# Patient Record
Sex: Female | Born: 1940 | Race: White | Hispanic: No | Marital: Married | Smoking: Former smoker
Health system: Southern US, Community
[De-identification: ages and names within clinical notes are randomized; demographics above are authoritative.]

## PROBLEM LIST (undated history)

## (undated) DIAGNOSIS — IMO0002 Reserved for concepts with insufficient information to code with codable children: Secondary | ICD-10-CM

## (undated) DIAGNOSIS — N816 Rectocele: Secondary | ICD-10-CM

## (undated) DIAGNOSIS — J309 Allergic rhinitis, unspecified: Secondary | ICD-10-CM

## (undated) DIAGNOSIS — E785 Hyperlipidemia, unspecified: Secondary | ICD-10-CM

## (undated) DIAGNOSIS — Q613 Polycystic kidney, unspecified: Secondary | ICD-10-CM

## (undated) DIAGNOSIS — I10 Essential (primary) hypertension: Secondary | ICD-10-CM

## (undated) DIAGNOSIS — Z780 Asymptomatic menopausal state: Secondary | ICD-10-CM

## (undated) DIAGNOSIS — K638219 Small intestinal bacterial overgrowth, unspecified: Secondary | ICD-10-CM

## (undated) DIAGNOSIS — Z993 Dependence on wheelchair: Secondary | ICD-10-CM

## (undated) DIAGNOSIS — M719 Bursopathy, unspecified: Secondary | ICD-10-CM

## (undated) DIAGNOSIS — F329 Major depressive disorder, single episode, unspecified: Secondary | ICD-10-CM

## (undated) DIAGNOSIS — F32A Depression, unspecified: Secondary | ICD-10-CM

## (undated) HISTORY — PX: BLADDER SURGERY: SHX569

## (undated) HISTORY — DX: Rectocele: N81.6

## (undated) HISTORY — PX: APPENDECTOMY: SHX54

## (undated) HISTORY — DX: Depression, unspecified: F32.A

## (undated) HISTORY — PX: BREAST SURGERY: SHX581

## (undated) HISTORY — DX: Reserved for concepts with insufficient information to code with codable children: IMO0002

## (undated) HISTORY — DX: Polycystic kidney, unspecified: Q61.3

## (undated) HISTORY — DX: Major depressive disorder, single episode, unspecified: F32.9

## (undated) HISTORY — DX: Hyperlipidemia, unspecified: E78.5

## (undated) HISTORY — DX: Allergic rhinitis, unspecified: J30.9

## (undated) HISTORY — DX: Bursopathy, unspecified: M71.9

## (undated) HISTORY — DX: Essential (primary) hypertension: I10

## (undated) HISTORY — DX: Asymptomatic menopausal state: Z78.0

## (undated) HISTORY — PX: LAPAROTOMY: SHX154

## (undated) HISTORY — PX: ANTERIOR AND POSTERIOR VAGINAL REPAIR: SUR5

---

## 1973-05-20 HISTORY — PX: ABDOMINAL HYSTERECTOMY: SHX81

## 1990-05-20 HISTORY — PX: REDUCTION MAMMAPLASTY: SUR839

## 2004-09-20 ENCOUNTER — Ambulatory Visit: Payer: Self-pay | Admitting: Internal Medicine

## 2005-10-17 ENCOUNTER — Ambulatory Visit: Payer: Self-pay | Admitting: Obstetrics and Gynecology

## 2006-10-23 ENCOUNTER — Ambulatory Visit: Payer: Self-pay | Admitting: Internal Medicine

## 2007-10-29 ENCOUNTER — Ambulatory Visit: Payer: Self-pay | Admitting: Internal Medicine

## 2008-11-01 ENCOUNTER — Ambulatory Visit: Payer: Self-pay | Admitting: Internal Medicine

## 2008-12-27 ENCOUNTER — Ambulatory Visit: Payer: Self-pay | Admitting: Gastroenterology

## 2008-12-30 ENCOUNTER — Ambulatory Visit: Payer: Self-pay | Admitting: Gastroenterology

## 2009-12-13 ENCOUNTER — Ambulatory Visit: Payer: Self-pay | Admitting: Internal Medicine

## 2010-06-27 ENCOUNTER — Ambulatory Visit: Payer: Self-pay | Admitting: Urology

## 2011-02-04 ENCOUNTER — Ambulatory Visit: Payer: Self-pay | Admitting: Internal Medicine

## 2011-11-19 ENCOUNTER — Ambulatory Visit: Payer: Self-pay | Admitting: Obstetrics and Gynecology

## 2011-11-19 LAB — BASIC METABOLIC PANEL
Anion Gap: 6 — ABNORMAL LOW (ref 7–16)
BUN: 17 mg/dL (ref 7–18)
Calcium, Total: 9.4 mg/dL (ref 8.5–10.1)
Creatinine: 0.9 mg/dL (ref 0.60–1.30)
EGFR (African American): >60
EGFR (Non-African Amer.): >60
Glucose: 95 mg/dL (ref 65–99)
Sodium: 135 mmol/L — ABNORMAL LOW (ref 136–145)

## 2011-11-19 LAB — CBC
HCT: 41.2 % (ref 35.0–47.0)
HGB: 13.9 g/dL (ref 12.0–16.0)
MCH: 31.6 pg (ref 26.0–34.0)
MCHC: 33.7 g/dL (ref 32.0–36.0)
Platelet: 250 10*3/uL (ref 150–440)
RBC: 4.4 10*6/uL (ref 3.80–5.20)
WBC: 7.3 10*3/uL (ref 3.6–11.0)

## 2011-11-25 ENCOUNTER — Ambulatory Visit: Payer: Self-pay | Admitting: Obstetrics and Gynecology

## 2011-11-26 LAB — URINALYSIS, COMPLETE
Bilirubin,UR: NEGATIVE
Blood: NEGATIVE
Leukocyte Esterase: NEGATIVE
Ph: 6 (ref 4.5–8.0)
Protein: NEGATIVE
RBC,UR: 7 /HPF (ref 0–5)
Squamous Epithelial: <1

## 2012-06-02 ENCOUNTER — Ambulatory Visit: Payer: Self-pay | Admitting: Internal Medicine

## 2012-11-30 ENCOUNTER — Ambulatory Visit: Payer: Self-pay | Admitting: Ophthalmology

## 2012-12-03 ENCOUNTER — Other Ambulatory Visit: Payer: Self-pay | Admitting: Ophthalmology

## 2012-12-03 LAB — CBC WITH DIFFERENTIAL/PLATELET
Eosinophil #: 0.2 10*3/uL (ref 0.0–0.7)
Eosinophil %: 3.7 %
HGB: 13.5 g/dL (ref 12.0–16.0)
MCH: 32.3 pg (ref 26.0–34.0)
Monocyte #: 0.4 x10 3/mm (ref 0.2–0.9)
Monocyte %: 8.1 %
Neutrophil #: 3 10*3/uL (ref 1.4–6.5)
Platelet: 246 10*3/uL (ref 150–440)
RBC: 4.2 10*6/uL (ref 3.80–5.20)
WBC: 4.8 10*3/uL (ref 3.6–11.0)

## 2014-01-17 DIAGNOSIS — I10 Essential (primary) hypertension: Secondary | ICD-10-CM | POA: Insufficient documentation

## 2014-01-17 DIAGNOSIS — J309 Allergic rhinitis, unspecified: Secondary | ICD-10-CM | POA: Insufficient documentation

## 2014-01-17 DIAGNOSIS — E785 Hyperlipidemia, unspecified: Secondary | ICD-10-CM | POA: Insufficient documentation

## 2014-06-20 LAB — HM MAMMOGRAPHY

## 2014-06-30 ENCOUNTER — Ambulatory Visit: Payer: Self-pay | Admitting: Family Medicine

## 2014-09-11 NOTE — Op Note (Signed)
PATIENT NAME:  Tracy Herman, Tracy Herman DATE OF BIRTH:  1940-09-12  DATE OF PROCEDURE:  11/25/2011  PREOPERATIVE DIAGNOSES:  1. Cystocele.  2. Rectocele.  3. Vaginal enterocele.   POSTOPERATIVE DIAGNOSES:  1. Cystocele.  2. Rectocele.  3. Vaginal enterocele.   OPERATIVE PROCEDURES:  1. Anterior colporrhaphy.  2. Posterior colporrhaphy.  3. Repair of vaginal enterocele with enterocele ligation.   SURGEON: Dr. Greggory KeeneFrancesco    ANESTHESIA: General endotracheal.   INDICATIONS: The patient is a 74 year old married white female, status post hysterectomy in the past, with symptomatic pelvic relaxation who desires definitive surgery.   FINDINGS AT SURGERY: A second- to third-degree cystocele, moderate rectocele, and enterocele. The enterocele sac was opened, explored, and closed with a pursestring suture. No significant adhesive disease was found.   DESCRIPTION OF PROCEDURE: The patient was brought to the operating room where she was placed in the supine position. General endotracheal anesthesia was induced without difficulty. She was placed in the dorsal lithotomy position using the candy-cane stirrups. A Betadine perineal/intravaginal prep and drape was performed in the standard fashion. Foley catheter was placed and was draining clear yellow urine from the bladder. The defects were noted on vaginal exam. The dome of the bladder was grasped with Allis clamps. A transverse incision between the Allis clamps was made. The vaginal mucosa was undermined with Metzenbaum scissors and incised in the midline. Sequentially the vaginal mucosa was dissected from the perivesical fascia using sharp and blunt dissection. Allis-Adair clamps were used to facilitate exposure. Once the bladder was adequately mobilized, the cystocele was repaired using vertical mattress sutures of 2-0 Vicryl suture. This was nicely accomplished. The excess vaginal mucosa was trimmed and the mucosa was then reapproximated in the  midline using 2-0 chromic sutures in a simple interrupted manner. Next the posterior colporrhaphy was performed. A diamond-shaped wedge of tissue was excised from the posterior fourchette with a scalpel. The vaginal mucosa was undermined with Metzenbaum scissors and incised in the midline. Allis-Adair retractors were used to facilitate exposure. The perirectal fascia was dissected from the vaginal mucosa through sharp and blunt dissection. The enterocele was exposed. The enterocele was entered. The enterocele sac did not demonstrate any significant adhesions. It was closed using a pursestring suture of 0 Vicryl. Next, the rectocele repair was performed using horizontal mattress sutures of  0 Vicryl. A nice shelf was created. The excess vaginal mucosa was trimmed and the vagina was reapproximated in the midline using 2-0 chromic sutures in a simple interrupted manner. Upon completion of the repair, the vagina was packed with Kerlix coated with Premarin cream. The patient was then awakened, mobilized, and taken to the recovery room in satisfactory condition.  Estimated blood loss was 100 mL.  IV fluids were 1300 mL of crystalloid. Urine output was 100 mL of clear urine.     ____________________________ Tracy DockerMartin A. Reece Mcbroom, MD mad:bjt D: 11/25/2011 15:00:43 ET T: 11/25/2011 17:46:04 ET JOB#: [id number]  cc: Daphine DeutscherMartin A. Tedford Berg, MD, <Dictator> Tracy DockerMARTIN A Shandiin Eisenbeis MD ELECTRONICALLY SIGNED 11/28/2011 19:05

## 2014-09-11 NOTE — H&P (Signed)
PATIENT NAME:  Tracy Herman, Tracy Herman MR#:  [id number] DATE OF BIRTH:  [birth year]  DATE OF ADMISSION:  11/25/2011  PREOPERATIVE DIAGNOSES: Pelvic organ prolapse.  HISTORY OF PRESENT ILLNESS: Tracy Herman is a 74 year old married white female, status post total abdominal hysterectomy on Estrace 0.3 mg p.o. daily and Estrace cream 1 gram intravaginally biweekly, who presents for pelvic organ prolapse repair. The patient has symptomatic second to third degree cystocele, large rectocele, and enterocele for which she would like to have surgical correction. She has been previously using pessary for support but now desires definitive surgery.   PAST MEDICAL HISTORY: 1. Allergic rhinitis.  2. Polycystic kidney disease.  3. Hypertension.  4. Hyperlipidemia.  5. Depression.  6. 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  7. Rectocele, large.  8. Vaginal enterocele.   PAST SURGICAL HISTORY:  1. Bilateral breast reduction 1992. 2. 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 3. Total abdominal hysterectomy in 1975. 4. Macroplastique injections for incontinence.  5. Bladder surgery.  PAST OBSTETRICS HISTORY: Para 1-0-0-2, spontaneous vaginal delivery x1.   FAMILY HISTORY: Negative for cancer of the colon, ovary, or breast.   SOCIAL HISTORY: The patient is a former smoker. She quit in 1977. She denies alcohol use. 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   DRUG ALLERGIES: Zestril causes depression, codeine.   CURRENT MEDICATIONS: 1. Premarin cream 1 gram intravaginally biweekly.  2. Premarin 0.3 mg p.o. daily.  3. Benadryl p.r.n.  4. Captopril 25 mg t.i.d.  5. Triamterene/hydrochlorothiazide 50/25 one a day.   REVIEW OF SYSTEMS: The patient denies recent illness. She denies history of coagulopathy. She denies reactive airway disease. She does have incontinence and chronic constipation.   PHYSICAL EXAMINATION: VITAL  SIGNS: Height 63 inches, weight 128.5 pounds, body mass index 22.7. Heart rate 60, blood pressure 143/68.  GENERAL: The patient is a pleasant well-appearing elderly female in no acute distress. She is alert and oriented. Affect is appropriate.   HEENT: Oropharynx is clear.   NECK: Supple. There is no thyromegaly or adenopathy.   LUNGS: Clear.   HEART: Reveals a regularly irregular heartbeat without S3, S4 or murmur.   ABDOMEN: Soft and nontender. No organomegaly.   PELVIC EXAM: External genitalia with mild atrophic changes. BUS normal. Vagina notable for second to third degree cystourethrocele, large rectocele, and enterocele. Rectal sphincter tone is normal.   EXTREMITIES: Without clubbing, cyanosis, or edema.   SKIN: Without rash.   MUSCULOSKELETAL: Exam is normal.   IMPRESSION: Pelvic organ prolapse with second to third degree cystourethrocele, a large rectocele, and enterocele.   PLAN: Anterior posterior colporrhaphy with enterocele ligation. Date of surgery is 11/25/2011.  CONSENT NOTE: Tracy GuilesMyra Herman is to undergo pelvic organ prolapse surgery. She is understanding of the planned procedures and is aware of and is accepting of all surgical risks which include but are not limited to bleeding, infection, pelvic organ injury with need for repair, blood clot disorders, anesthesia risks, and death. The patient understands that incontinence could become worse with the repair. She understands that she may have postoperative urinary retention which may require catheterizations temporarily. She is accepting of all risks and wishes to proceed with the surgery. Consent is given. All questions are answered.     ____________________________ Prentice DockerMartin A. Andilynn Delavega, MD mad:kma D: 11/22/2011 11:50:46 ET T: 11/22/2011 12:18:30 ET JOB#: [id number]  cc: Daphine DeutscherMartin A. Byrd Rushlow, MD, <Dictator> [facility] Prentice DockerMARTIN A Callie Facey MD ELECTRONICALLY SIGNED 11/24/2011  12:03 

## 2015-08-14 LAB — HM PAP SMEAR

## 2015-08-14 LAB — HM COLONOSCOPY

## 2015-08-29 ENCOUNTER — Ambulatory Visit (INDEPENDENT_AMBULATORY_CARE_PROVIDER_SITE_OTHER): Payer: Medicare Other | Admitting: Obstetrics and Gynecology

## 2015-08-29 ENCOUNTER — Encounter: Payer: Self-pay | Admitting: Obstetrics and Gynecology

## 2015-08-29 VITALS — BP 150/81 | HR 88 | Ht 63.0 in | Wt 136.5 lb

## 2015-08-29 DIAGNOSIS — Z1239 Encounter for other screening for malignant neoplasm of breast: Secondary | ICD-10-CM

## 2015-08-29 DIAGNOSIS — N8110 Cystocele, unspecified: Secondary | ICD-10-CM

## 2015-08-29 DIAGNOSIS — Z79890 Hormone replacement therapy (postmenopausal): Secondary | ICD-10-CM | POA: Diagnosis not present

## 2015-08-29 DIAGNOSIS — L989 Disorder of the skin and subcutaneous tissue, unspecified: Secondary | ICD-10-CM

## 2015-08-29 DIAGNOSIS — K469 Unspecified abdominal hernia without obstruction or gangrene: Secondary | ICD-10-CM

## 2015-08-29 DIAGNOSIS — Z124 Encounter for screening for malignant neoplasm of cervix: Secondary | ICD-10-CM | POA: Diagnosis not present

## 2015-08-29 DIAGNOSIS — IMO0002 Reserved for concepts with insufficient information to code with codable children: Secondary | ICD-10-CM

## 2015-08-29 DIAGNOSIS — Z1211 Encounter for screening for malignant neoplasm of colon: Secondary | ICD-10-CM

## 2015-08-29 DIAGNOSIS — N816 Rectocele: Secondary | ICD-10-CM | POA: Diagnosis not present

## 2015-08-29 MED ORDER — ESTRADIOL 1 MG PO TABS: 1.0000 mg | ORAL_TABLET | Freq: Every day | ORAL | Status: DC

## 2015-08-29 MED ORDER — ESTROGENS, CONJUGATED 0.625 MG/GM VA CREA: TOPICAL_CREAM | VAGINAL | Status: DC

## 2015-08-29 NOTE — Progress Notes (Signed)
Patient ID: Tracy FosterMyra C Herman, female   DOB: 12/17/1940, 75 y.o.   MRN: [id number]. ANNUAL PREVENTATIVE CARE GYN  ENCOUNTER NOTE  Subjective:       Tracy Herman is a 75 y.o. No obstetric history on file. female here for a routine annual gynecologic exam.  Current complaints: 1.  none   Status post A&P repair with enterocele ligation; asymptomatic Currently taking estradiol 1 mg daily for postmenopausal ERT therapy    Gynecologic History No LMP recorded. Patient has had a hysterectomy. Contraception: status post hysterectomy Last Pap: not needed. Results were: normal Last mammogram: 06/2014. Results were: normal Status post anterior/posterior colporrhaphy with enterocele ligation  Obstetric History OB History  No data available    Past Medical History  Diagnosis Date  . Hypertension   . Hyperlipemia   . PKD (polycystic kidney disease)   . AR (allergic rhinitis)   . Depression   . Bursitis   . Cystocele   . Menopause   . Rectocele     Past Surgical History  Procedure Laterality Date  . Anterior and posterior vaginal repair      WITH ENTEROCELE LIGATION  . Breast surgery      BILATERAL BREAST REDUCTION  . Abdominal hysterectomy  1975    TAH- MENORRHAGIA - FIBROIDS  . Bladder surgery    . Appendectomy    . Laparotomy      W.PARTIAL BOWEL RESECTION D/T BOWEL NECROSIS TO ADHESIONS    Current Outpatient Prescriptions on File Prior to Visit  Medication Sig Dispense Refill  . captopril (CAPOTEN) 25 MG tablet Take 25 mg by mouth 3 (three) times daily.    Marland Kitchen. estradiol (ESTRACE) 1 MG tablet Take 1 mg by mouth daily.    . naproxen sodium (ANAPROX) 220 MG tablet Take 220 mg by mouth 2 (two) times daily with a meal.    . triamterene-hydrochlorothiazide (DYAZIDE) 50-25 MG capsule Take 1 capsule by mouth every morning.     No current facility-administered medications on file prior to visit.    Allergies  Allergen Reactions  . Codeine   . Zestril [Lisinopril]     Social History    Social History  . Marital Status: Married    Spouse Name: N/A  . Number of Children: N/A  . Years of Education: N/A   Occupational History  . Not on file.   Social History Main Topics  . Smoking status: Former Games developermoker  . Smokeless tobacco: Not on file  . Alcohol Use: Yes     Comment: RED WINE QD  . Drug Use: No  . Sexual Activity: Not Currently    Birth Control/ Protection: Surgical   Other Topics Concern  . Not on file   Social History Narrative    Family History  Problem Relation Age of Onset  . Polycystic kidney disease Mother   . Polycystic kidney disease Sister   . Cancer Neg Hx   . Diabetes Neg Hx   . Heart disease Neg Hx     The following portions of the patient's history were reviewed and updated as appropriate: allergies, current medications, past family history, past medical history, past social history, past surgical history and problem list.  Review of Systems ROS Review of Systems - General ROS: negative for - chills, fatigue, fever, hot flashes, night sweats, weight gain or weight loss Psychological ROS: negative for - anxiety, decreased libido, depression, mood swings, physical abuse or sexual abuse Ophthalmic ROS: negative for - blurry vision, eye pain  or loss of vision ENT ROS: negative for - headaches, hearing change, visual changes or vocal changes Allergy and Immunology ROS: negative for - hives, itchy/watery eyes or seasonal allergies Hematological and Lymphatic ROS: negative for - bleeding problems, bruising, swollen lymph nodes or weight loss Endocrine ROS: negative for - galactorrhea, hair pattern changes, hot flashes, malaise/lethargy, mood swings, palpitations, polydipsia/polyuria, skin changes, temperature intolerance or unexpected weight changes Breast ROS: negative for - new or changing breast lumps or nipple discharge Respiratory ROS: negative for - cough or shortness of breath Cardiovascular ROS: negative for - chest pain, irregular  heartbeat, palpitations or shortness of breath Gastrointestinal ROS: no abdominal pain, change in bowel habits, or black or bloody stools Genito-Urinary ROS: no dysuria, trouble voiding, or hematuria Musculoskeletal ROS: negative for - joint pain or joint stiffness Neurological ROS: negative for - bowel and bladder control changes Dermatological ROS: negative for rash. POSITIVE-skin lesion on file, scaling   Objective:   BP 150/81 mmHg  Pulse 88  Ht  (1.6 m)  Wt 136 lb 8 oz (61.916 kg)  BMI 24.19 kg/m2 CONSTITUTIONAL: Well-developed, well-nourished female in no acute distress.  PSYCHIATRIC: Normal mood and affect. Normal behavior. Normal judgment and thought content. NEUROLGIC: Alert and oriented to person, place, and time. Normal muscle tone coordination. No cranial nerve deficit noted. HENT:  Normocephalic, atraumatic EYES: Conjunctivae and EOM are normal. No scleral icterus.  NECK: Normal range of motion, supple, no masses.  Normal thyroid.  SKIN: Skin is warm and dry. No rash noted. Not diaphoretic. No erythema. No pallor. Scaly lesion left foot between second and third digits CARDIOVASCULAR: Normal heart rate noted, regular rhythm, no murmur. RESPIRATORY: Clear to auscultation bilaterally. Effort and breath sounds normal, no problems with respiration noted. BREASTS: Symmetric in size. No masses, skin changes, nipple drainage, or lymphadenopathy. Mammoplasty scars well-healed ABDOMEN: Soft, no distention noted.  No tenderness, rebound or guarding.  BLADDER: Normal PELVIC:  External Genitalia: Normal  BUS: Normal  Vagina: Normal estrogen effect; first degree cystocele, Mild rectocele  Cervix: surgically absent  Uterus:surgically absent  Adnexa: Normal  RV: External Exam NormaI, No Rectal Masses and Normal Sphincter tone  MUSCULOSKELETAL: Normal range of motion. No tenderness.  No cyanosis, clubbing, or edema.  2+ distal pulses. LYMPHATIC: No Axillary, Supraclavicular, or  Inguinal Adenopathy.    Assessment:   Annual gynecologic examination 75 y.o. Contraception: status post hysterectomy bmi- 23 Status post anterior/posterior colporrhaphy with enterocele ligation Menopausal state, asymptomatic on ERT therapy; desires to continue  Plan:  Pap: Not needed Mammogram: Ordered Stool Guaiac Testing:  Ordered Labs: thru pcp Routine preventative health maintenance measures emphasized: Exercise/Diet/Weight control, Tobacco Warnings and Alcohol/Substance use risks Recommend dermatology evaluation for left foot skin lesions Return to Clinic - 1 Year   Crystal Woodland, CMA  Herold Harms, MD  Note: This dictation was prepared with Dragon dictation along with smaller phrase technology. Any transcriptional errors that result from this process are unintentional.

## 2015-08-29 NOTE — Patient Instructions (Signed)
1. No Pap smear is needed 2. Mammogram ordered 3. Stool guaiac cards are given for colon cancer screening 4. Estradiol 1 mg daily is refilled her 1 year 5. Calcium with vitamin D supplementation is encouraged 6. Recommend dermatology evaluation of skin lesions on foot 7. Return in 1 year

## 2015-10-07 LAB — FECAL OCCULT BLOOD, IMMUNOCHEMICAL: FECAL OCCULT BLD: NEGATIVE

## 2015-11-27 ENCOUNTER — Ambulatory Visit: Payer: Self-pay

## 2015-12-12 ENCOUNTER — Other Ambulatory Visit: Payer: Self-pay | Admitting: Obstetrics and Gynecology

## 2015-12-12 ENCOUNTER — Ambulatory Visit
Admission: RE | Admit: 2015-12-12 | Discharge: 2015-12-12 | Disposition: A | Discharge status: Home / Self Care | Payer: Medicare Other | Source: Ambulatory Visit | Attending: Obstetrics and Gynecology | Admitting: Obstetrics and Gynecology

## 2015-12-12 DIAGNOSIS — Z1231 Encounter for screening mammogram for malignant neoplasm of breast: Secondary | ICD-10-CM | POA: Insufficient documentation

## 2015-12-12 DIAGNOSIS — Z1239 Encounter for other screening for malignant neoplasm of breast: Secondary | ICD-10-CM

## 2016-10-02 ENCOUNTER — Telehealth: Payer: Self-pay | Admitting: Obstetrics and Gynecology

## 2016-10-02 MED ORDER — ESTRADIOL 1 MG PO TABS: 1.0000 mg | ORAL_TABLET | Freq: Every day | ORAL | 1 refills | Status: DC

## 2016-10-02 NOTE — Telephone Encounter (Signed)
Pt aware per vm - med erx.  

## 2016-10-02 NOTE — Telephone Encounter (Signed)
Patient called requesting a refill on estradiol. She will be out in 8 days. She is scheduled for her annual 6/5. Thanks

## 2016-10-21 NOTE — Progress Notes (Signed)
Patient ID: Tracy Herman, female   DOB: 1941/03/17, 76 y.o.   MRN: [id number]. ANNUAL PREVENTATIVE CARE GYN  ENCOUNTER NOTE  Subjective:       Tracy Herman is a 76 y.o. G1 P1001. female here for a routine annual gynecologic exam.  Current complaints: 1.  none   Status post A&P repair with enterocele ligation; asymptomatic Currently using Premarin cream intravaginal twice a week; no systemic ERT. Bowel function is normal. Bladder function is normal.    Gynecologic History No LMP recorded. Patient has had a hysterectomy. Contraception: status post hysterectomy Last Pap: not needed. Results were: normal Last mammogram: 11/2015 birad 1. Results were: normal Status post anterior/posterior colporrhaphy with enterocele ligation  Obstetric History OB History  No data available    Past Medical History:  Diagnosis Date  . AR (allergic rhinitis)   . Bursitis   . Cystocele   . Depression   . Hyperlipemia   . Hypertension   . Menopause   . PKD (polycystic kidney disease)   . Rectocele     Past Surgical History:  Procedure Laterality Date  . ABDOMINAL HYSTERECTOMY  1975   TAH- MENORRHAGIA - FIBROIDS  . ANTERIOR AND POSTERIOR VAGINAL REPAIR     WITH ENTEROCELE LIGATION  . APPENDECTOMY    . BLADDER SURGERY    . BREAST SURGERY     BILATERAL BREAST REDUCTION  . LAPAROTOMY     W.PARTIAL BOWEL RESECTION D/T BOWEL NECROSIS TO ADHESIONS  . REDUCTION MAMMAPLASTY Bilateral 1992    Current Outpatient Prescriptions on File Prior to Visit  Medication Sig Dispense Refill  . aspirin EC 81 MG tablet Take by mouth.    . captopril (CAPOTEN) 25 MG tablet Take 25 mg by mouth 3 (three) times daily.    Marland Kitchen conjugated estrogens (PREMARIN) vaginal cream Place vaginally 2 (two) times a week. 0.5 twice weekly 42.5 g 3  . estradiol (ESTRACE) 1 MG tablet Take 1 tablet (1 mg total) by mouth daily. 90 tablet 1  . naproxen sodium (ANAPROX) 220 MG tablet Take 220 mg by mouth 2 (two) times daily with a meal.     . triamterene-hydrochlorothiazide (DYAZIDE) 50-25 MG capsule Take 1 capsule by mouth every morning.     No current facility-administered medications on file prior to visit.     Allergies  Allergen Reactions  . Codeine   . Zestril [Lisinopril]     Social History   Social History  . Marital status: Married    Spouse name: N/A  . Number of children: N/A  . Years of education: N/A   Occupational History  . Not on file.   Social History Main Topics  . Smoking status: Former Games developer  . Smokeless tobacco: Not on file  . Alcohol use Yes     Comment: RED WINE QD  . Drug use: No  . Sexual activity: Not Currently    Birth control/ protection: Surgical   Other Topics Concern  . Not on file   Social History Narrative  . No narrative on file    Family History  Problem Relation Age of Onset  . Polycystic kidney disease Mother   . Polycystic kidney disease Sister   . Cancer Neg Hx   . Diabetes Neg Hx   . Heart disease Neg Hx     The following portions of the patient's history were reviewed and updated as appropriate: allergies, current medications, past family history, past medical history, past social history, past surgical history and  problem list.  Review of Systems Review of Systems  Constitutional: Negative.   HENT: Negative.   Eyes: Negative.   Respiratory: Negative.   Cardiovascular: Negative.   Gastrointestinal: Negative.   Genitourinary: Negative.   Musculoskeletal: Negative.   Skin: Negative.   Neurological: Negative.   Endo/Heme/Allergies: Negative.   Psychiatric/Behavioral: Negative.      Objective:   BP 127/71   Pulse 73   Ht 5\' 3"  (1.6 m)   Wt 133 lb 12.8 oz (60.7 kg)   BMI 23.70 kg/m  CONSTITUTIONAL: Well-developed, well-nourished female in no acute distress.  PSYCHIATRIC: Normal mood and affect. Normal behavior. Normal judgment and thought content. NEUROLGIC: Alert and oriented to person, place, and time. Normal muscle tone coordination. No  cranial nerve deficit noted. HENT:  Normocephalic, atraumatic EYES: Conjunctivae and EOM are normal. No scleral icterus.  NECK: Normal range of motion, supple, no masses.  Normal thyroid.  SKIN: Skin is warm and dry. No rash noted. Not diaphoretic. No erythema. No pallor.  CARDIOVASCULAR: Normal heart rate noted, regular rhythm, no murmur. RESPIRATORY: Clear to auscultation bilaterally. Effort and breath sounds normal, no problems with respiration noted. BREASTS: Symmetric in size. No masses, skin changes, nipple drainage, or lymphadenopathy. Mammoplasty scars well-healed ABDOMEN: Soft, no distention noted.  No tenderness, rebound or guarding. Midline laparotomy incision well-healed BLADDER: Normal PELVIC:  External Genitalia: Normal  BUS: Normal  Vagina: Normal estrogen effect; first degree cystocele, Mild rectocele  Cervix: surgically absent  Uterus:surgically absent  Adnexa: Normal; nonpalpable and nontender  RV: External Exam NormaI, No Rectal Masses and Normal Sphincter tone  MUSCULOSKELETAL: Normal range of motion. No tenderness.  No cyanosis, clubbing, or edema.  2+ distal pulses. LYMPHATIC: No Axillary, Supraclavicular, or Inguinal Adenopathy.    Assessment:   Annual gynecologic examination 76 y.o. Contraception: status post hysterectomy bmi- 23 Status post anterior/posterior colporrhaphy with enterocele ligation Menopausal state, asymptomatic on ERT therapy; desires to continue  Plan:  Pap: Not needed Mammogram: Ordered Stool Guaiac Testing:  Ordered Labs: thru pcp Routine preventative health maintenance measures emphasized: Exercise/Diet/Weight control, Tobacco Warnings and Alcohol/Substance use risks  Premarin cream intravaginal twice a week refilled Return to Clinic - 1 Year   SunGardCrystal Herman, CMA  Herold HarmsMartin A Harding Thomure, MD  Note: This dictation was prepared with Dragon dictation along with smaller phrase technology. Any transcriptional errors that result from  this process are unintentional.

## 2016-10-22 ENCOUNTER — Encounter: Payer: Self-pay | Admitting: Obstetrics and Gynecology

## 2016-10-22 ENCOUNTER — Ambulatory Visit (INDEPENDENT_AMBULATORY_CARE_PROVIDER_SITE_OTHER): Payer: Medicare Other | Admitting: Obstetrics and Gynecology

## 2016-10-22 VITALS — BP 127/71 | HR 73 | Ht 63.0 in | Wt 133.8 lb

## 2016-10-22 DIAGNOSIS — Z1231 Encounter for screening mammogram for malignant neoplasm of breast: Secondary | ICD-10-CM

## 2016-10-22 DIAGNOSIS — Z79890 Hormone replacement therapy (postmenopausal): Secondary | ICD-10-CM | POA: Diagnosis not present

## 2016-10-22 DIAGNOSIS — Z1211 Encounter for screening for malignant neoplasm of colon: Secondary | ICD-10-CM

## 2016-10-22 DIAGNOSIS — N816 Rectocele: Secondary | ICD-10-CM

## 2016-10-22 DIAGNOSIS — Z780 Asymptomatic menopausal state: Secondary | ICD-10-CM

## 2016-10-22 DIAGNOSIS — Z1239 Encounter for other screening for malignant neoplasm of breast: Secondary | ICD-10-CM

## 2016-10-22 DIAGNOSIS — K469 Unspecified abdominal hernia without obstruction or gangrene: Secondary | ICD-10-CM

## 2016-10-22 DIAGNOSIS — Z90710 Acquired absence of both cervix and uterus: Secondary | ICD-10-CM

## 2016-10-22 DIAGNOSIS — Z01419 Encounter for gynecological examination (general) (routine) without abnormal findings: Secondary | ICD-10-CM

## 2016-10-22 NOTE — Patient Instructions (Signed)
1. No Pap smear is needed 2. Mammogram is ordered 3. Stool guaiac cards for colon cancer screening are given 4. Routine screening labs are through primary care 5. Continue with healthy eating and exercise 6. Continue with Premarin cream intravaginal twice a week 7. Return in 1 year for follow-up   Health Maintenance for Postmenopausal Women Menopause is a normal process in which your reproductive ability comes to an end. This process happens gradually over a span of months to years, usually between the ages of 56 and 69. Menopause is complete when you have missed 12 consecutive menstrual periods. It is important to talk with your health care provider about some of the most common conditions that affect postmenopausal women, such as heart disease, cancer, and bone loss (osteoporosis). Adopting a healthy lifestyle and getting preventive care can help to promote your health and wellness. Those actions can also lower your chances of developing some of these common conditions. What should I know about menopause? During menopause, you may experience a number of symptoms, such as:  Moderate-to-severe hot flashes.  Night sweats.  Decrease in sex drive.  Mood swings.  Headaches.  Tiredness.  Irritability.  Memory problems.  Insomnia.  Choosing to treat or not to treat menopausal changes is an individual decision that you make with your health care provider. What should I know about hormone replacement therapy and supplements? Hormone therapy products are effective for treating symptoms that are associated with menopause, such as hot flashes and night sweats. Hormone replacement carries certain risks, especially as you become older. If you are thinking about using estrogen or estrogen with progestin treatments, discuss the benefits and risks with your health care provider. What should I know about heart disease and stroke? Heart disease, heart attack, and stroke become more likely as you  age. This may be due, in part, to the hormonal changes that your body experiences during menopause. These can affect how your body processes dietary fats, triglycerides, and cholesterol. Heart attack and stroke are both medical emergencies. There are many things that you can do to help prevent heart disease and stroke:  Have your blood pressure checked at least every 1-2 years. High blood pressure causes heart disease and increases the risk of stroke.  If you are 2-56 years old, ask your health care provider if you should take aspirin to prevent a heart attack or a stroke.  Do not use any tobacco products, including cigarettes, chewing tobacco, or electronic cigarettes. If you need help quitting, ask your health care provider.  It is important to eat a healthy diet and maintain a healthy weight. ? Be sure to include plenty of vegetables, fruits, low-fat dairy products, and lean protein. ? Avoid eating foods that are high in solid fats, added sugars, or salt (sodium).  Get regular exercise. This is one of the most important things that you can do for your health. ? Try to exercise for at least 150 minutes each week. The type of exercise that you do should increase your heart rate and make you sweat. This is known as moderate-intensity exercise. ? Try to do strengthening exercises at least twice each week. Do these in addition to the moderate-intensity exercise.  Know your numbers.Ask your health care provider to check your cholesterol and your blood glucose. Continue to have your blood tested as directed by your health care provider.  What should I know about cancer screening? There are several types of cancer. Take the following steps to reduce your risk  and to catch any cancer development as early as possible. Breast Cancer  Practice breast self-awareness. ? This means understanding how your breasts normally appear and feel. ? It also means doing regular breast self-exams. Let your health  care provider know about any changes, no matter how small.  If you are 41 or older, have a clinician do a breast exam (clinical breast exam or CBE) every year. Depending on your age, family history, and medical history, it may be recommended that you also have a yearly breast X-ray (mammogram).  If you have a family history of breast cancer, talk with your health care provider about genetic screening.  If you are at high risk for breast cancer, talk with your health care provider about having an MRI and a mammogram every year.  Breast cancer (BRCA) gene test is recommended for women who have family members with BRCA-related cancers. Results of the assessment will determine the need for genetic counseling and BRCA1 and for BRCA2 testing. BRCA-related cancers include these types: ? Breast. This occurs in males or females. ? Ovarian. ? Tubal. This may also be called fallopian tube cancer. ? Cancer of the abdominal or pelvic lining (peritoneal cancer). ? Prostate. ? Pancreatic.  Cervical, Uterine, and Ovarian Cancer Your health care provider may recommend that you be screened regularly for cancer of the pelvic organs. These include your ovaries, uterus, and vagina. This screening involves a pelvic exam, which includes checking for microscopic changes to the surface of your cervix (Pap test).  For women ages 21-65, health care providers may recommend a pelvic exam and a Pap test every three years. For women ages 82-65, they may recommend the Pap test and pelvic exam, combined with testing for human papilloma virus (HPV), every five years. Some types of HPV increase your risk of cervical cancer. Testing for HPV may also be done on women of any age who have unclear Pap test results.  Other health care providers may not recommend any screening for nonpregnant women who are considered low risk for pelvic cancer and have no symptoms. Ask your health care provider if a screening pelvic exam is right for  you.  If you have had past treatment for cervical cancer or a condition that could lead to cancer, you need Pap tests and screening for cancer for at least 20 years after your treatment. If Pap tests have been discontinued for you, your risk factors (such as having a new sexual partner) need to be reassessed to determine if you should start having screenings again. Some women have medical problems that increase the chance of getting cervical cancer. In these cases, your health care provider may recommend that you have screening and Pap tests more often.  If you have a family history of uterine cancer or ovarian cancer, talk with your health care provider about genetic screening.  If you have vaginal bleeding after reaching menopause, tell your health care provider.  There are currently no reliable tests available to screen for ovarian cancer.  Lung Cancer Lung cancer screening is recommended for adults 7-5 years old who are at high risk for lung cancer because of a history of smoking. A yearly low-dose CT scan of the lungs is recommended if you:  Currently smoke.  Have a history of at least 30 pack-years of smoking and you currently smoke or have quit within the past 15 years. A pack-year is smoking an average of one pack of cigarettes per day for one year.  Yearly screening  should:  Continue until it has been 15 years since you quit.  Stop if you develop a health problem that would prevent you from having lung cancer treatment.  Colorectal Cancer  This type of cancer can be detected and can often be prevented.  Routine colorectal cancer screening usually begins at age 16 and continues through age 29.  If you have risk factors for colon cancer, your health care provider may recommend that you be screened at an earlier age.  If you have a family history of colorectal cancer, talk with your health care provider about genetic screening.  Your health care provider may also recommend  using home test kits to check for hidden blood in your stool.  A small camera at the end of a tube can be used to examine your colon directly (sigmoidoscopy or colonoscopy). This is done to check for the earliest forms of colorectal cancer.  Direct examination of the colon should be repeated every 5-10 years until age 36. However, if early forms of precancerous polyps or small growths are found or if you have a family history or genetic risk for colorectal cancer, you may need to be screened more often.  Skin Cancer  Check your skin from head to toe regularly.  Monitor any moles. Be sure to tell your health care provider: ? About any new moles or changes in moles, especially if there is a change in a mole's shape or color. ? If you have a mole that is larger than the size of a pencil eraser.  If any of your family members has a history of skin cancer, especially at a young age, talk with your health care provider about genetic screening.  Always use sunscreen. Apply sunscreen liberally and repeatedly throughout the day.  Whenever you are outside, protect yourself by wearing long sleeves, pants, a wide-brimmed hat, and sunglasses.  What should I know about osteoporosis? Osteoporosis is a condition in which bone destruction happens more quickly than new bone creation. After menopause, you may be at an increased risk for osteoporosis. To help prevent osteoporosis or the bone fractures that can happen because of osteoporosis, the following is recommended:  If you are 32-53 years old, get at least 1,000 mg of calcium and at least 600 mg of vitamin D per day.  If you are older than age 4 but younger than age 53, get at least 1,200 mg of calcium and at least 600 mg of vitamin D per day.  If you are older than age 39, get at least 1,200 mg of calcium and at least 800 mg of vitamin D per day.  Smoking and excessive alcohol intake increase the risk of osteoporosis. Eat foods that are rich in  calcium and vitamin D, and do weight-bearing exercises several times each week as directed by your health care provider. What should I know about how menopause affects my mental health? Depression may occur at any age, but it is more common as you become older. Common symptoms of depression include:  Low or sad mood.  Changes in sleep patterns.  Changes in appetite or eating patterns.  Feeling an overall lack of motivation or enjoyment of activities that you previously enjoyed.  Frequent crying spells.  Talk with your health care provider if you think that you are experiencing depression. What should I know about immunizations? It is important that you get and maintain your immunizations. These include:  Tetanus, diphtheria, and pertussis (Tdap) booster vaccine.  Influenza every year  before the flu season begins.  Pneumonia vaccine.  Shingles vaccine.  Your health care provider may also recommend other immunizations. This information is not intended to replace advice given to you by your health care provider. Make sure you discuss any questions you have with your health care provider. Document Released: 06/28/2005 Document Revised: 11/24/2015 Document Reviewed: 02/07/2015 Elsevier Interactive Patient Education  2018 [employer].

## 2016-11-28 LAB — FECAL OCCULT BLOOD, IMMUNOCHEMICAL: Fecal Occult Bld: NEGATIVE

## 2017-01-02 ENCOUNTER — Ambulatory Visit
Admission: RE | Admit: 2017-01-02 | Discharge: 2017-01-02 | Disposition: A | Discharge status: Home / Self Care | Payer: Medicare Other | Source: Ambulatory Visit | Attending: Obstetrics and Gynecology | Admitting: Obstetrics and Gynecology

## 2017-01-02 DIAGNOSIS — Z1231 Encounter for screening mammogram for malignant neoplasm of breast: Secondary | ICD-10-CM | POA: Diagnosis present

## 2017-01-02 DIAGNOSIS — Z1239 Encounter for other screening for malignant neoplasm of breast: Secondary | ICD-10-CM

## 2017-04-29 ENCOUNTER — Other Ambulatory Visit: Payer: Self-pay | Admitting: Obstetrics and Gynecology

## 2017-09-22 ENCOUNTER — Other Ambulatory Visit: Payer: Self-pay | Admitting: Obstetrics and Gynecology

## 2017-09-24 ENCOUNTER — Telehealth: Payer: Self-pay | Admitting: Obstetrics and Gynecology

## 2017-09-24 NOTE — Telephone Encounter (Signed)
The patient called and sated that she would like to speak with Dr. Tommi Rumps or his nurse in regards to her needing a refill of estradiol (ESTRACE) 1 MG tablet, The patient also stated that she has switched her pharmacy to [facility] [address] and that she is having ,some issues with her insurance as well. Please advise.

## 2017-09-24 NOTE — Telephone Encounter (Signed)
Spoke with [name] at [facility]- They gave pt Self pay price for estadiol and used a coupon. 90 day supply is 24.00 with coupon card. Informed [name] to fill rx. Pt aware.

## 2017-10-17 NOTE — Progress Notes (Signed)
Patient ID: Tracy Herman, female   DOB: 30-Nov-1940, 77 y.o.   MRN: [id number]. MEDICARE PHYSICAL AND PELVIC ORGAN PROLAPSE FOLLOW-up  Subjective:       Tracy Herman is a 77 y.o. G1 P1001. female here for a routine annual gynecologic exam.  Current complaints: 1.  Bladder not emptying  Completely  2. Nocturia x 1  Status post A&P repair with enterocele ligation; asymptomatic; patient is now having slower urinary stream and does not feel that she is completely emptying her bladder.  She is experiencing nocturia x1.  She is not experiencing any significant stress incontinence. Currently using Premarin cream intravaginal twice a week; she also is taking estradiol 1/2 tablet daily as ERT; she desires to continue ERT and is aware of and is accepting of potential adverse risks with prolonged use. Bowel function is normal; she is avoiding colonoscopy because of history of pelvic kidney.  Bowel function is normal at this time. Bladder function is normal.    Gynecologic History No LMP recorded. Patient has had a hysterectomy. Contraception: status post hysterectomy Last Pap: not needed. Results were: normal Last mammogram: 12/2016 birad 1. Results were: normal Status post anterior/posterior colporrhaphy with enterocele ligation  Obstetric History OB History  Gravida Para Term Preterm AB Living  SAB TAB Ectopic Multiple Live Births          1    # Outcome Date GA Lbr Len/2nd Weight Sex Delivery Anes PTL Lv  1 Term 54    F Vag-Spont   LIV    Past Medical History:  Diagnosis Date  . AR (allergic rhinitis)   . Bursitis   . Cystocele   . Depression   . Hyperlipemia   . Hypertension   . Menopause   . PKD (polycystic kidney disease)   . Rectocele     Past Surgical History:  Procedure Laterality Date  . ABDOMINAL HYSTERECTOMY  1975   TAH- MENORRHAGIA - FIBROIDS  . ANTERIOR AND POSTERIOR VAGINAL REPAIR     WITH ENTEROCELE LIGATION  . APPENDECTOMY    . BLADDER SURGERY     . BREAST SURGERY     BILATERAL BREAST REDUCTION  . LAPAROTOMY     W.PARTIAL BOWEL RESECTION D/T BOWEL NECROSIS TO ADHESIONS  . REDUCTION MAMMAPLASTY Bilateral 1992    Current Outpatient Medications on File Prior to Visit  Medication Sig Dispense Refill  . aspirin EC 81 MG tablet Take by mouth.    . calcium carbonate 1250 MG capsule Take 1,250 mg by mouth 2 (two) times daily with a meal.    . captopril (CAPOTEN) 25 MG tablet Take 25 mg by mouth 3 (three) times daily.    . cholecalciferol (VITAMIN D) 1000 units tablet Take 1,000 Units by mouth daily.    . cyanocobalamin 100 MCG tablet Take 100 mcg by mouth daily.    Marland Kitchen estradiol (ESTRACE) 1 MG tablet TAKE 1 TABLET BY MOUTH EVERY DAY 90 tablet 0  . PREMARIN vaginal cream insert 0.5 applicatorful vaginally two times a week 30 g 3  . triamterene-hydrochlorothiazide (DYAZIDE) 50-25 MG capsule Take 1 capsule by mouth every morning.     No current facility-administered medications on file prior to visit.     Allergies  Allergen Reactions  . Codeine   . Zestril [Lisinopril]     Social History   Socioeconomic History  . Marital status: Married    Spouse name: Not on file  .  Number of children: Not on file  . Years of education: Not on file  . Highest education level: Not on file  Occupational History  . Not on file  Social Needs  . Financial resource strain: Not on file  . Food insecurity:    Worry: Not on file    Inability: Not on file  . Transportation needs:    Medical: Not on file    Non-medical: Not on file  Tobacco Use  . Smoking status: Former Smoker    Last attempt to quit: 1977    Years since quitting: 42.4  . Smokeless tobacco: Never Used  Substance and Sexual Activity  . Alcohol use: Yes    Comment: RED WINE QD  . Drug use: No  . Sexual activity: Not Currently    Birth control/protection: Surgical  Lifestyle  . Physical activity:    Days per week: Not on file    Minutes per session: Not on file  . Stress:  Not on file  Relationships  . Social connections:    Talks on phone: Not on file    Gets together: Not on file    Attends religious service: Not on file    Active member of club or organization: Not on file    Attends meetings of clubs or organizations: Not on file    Relationship status: Not on file  . Intimate partner violence:    Fear of current or ex partner: Not on file    Emotionally abused: Not on file    Physically abused: Not on file    Forced sexual activity: Not on file  Other Topics Concern  . Not on file  Social History Narrative  . Not on file    Family History  Problem Relation Age of Onset  . Polycystic kidney disease Mother   . Polycystic kidney disease Sister   . Cancer Neg Hx   . Diabetes Neg Hx   . Heart disease Neg Hx     The following portions of the patient's history were reviewed and updated as appropriate: allergies, current medications, past family history, past medical history, past social history, past surgical history and problem list.  Review of Systems  Review of Systems  Constitutional: Negative.   HENT: Negative.   Eyes: Negative.   Respiratory: Negative.   Cardiovascular: Negative.   Gastrointestinal: Negative.   Genitourinary:       Slow urinary stream Incomplete bladder emptying No significant pelvic pressure  Musculoskeletal: Negative.   Skin: Negative.   Neurological: Negative.   Endo/Heme/Allergies: Negative.   Psychiatric/Behavioral: Negative.     Objective:   BP (!) 156/63   Pulse 67   Ht  (1.6 m)   Wt 125 lb 14.4 oz (57.1 kg)   BMI 22.30 kg/m  CONSTITUTIONAL: Well-developed, well-nourished female in no acute distress.  PSYCHIATRIC: Normal mood and affect. Normal behavior. Normal judgment and thought content. NEUROLGIC: Alert and oriented to person, place, and time. Normal muscle tone coordination. No cranial nerve deficit noted. HENT:  Normocephalic, atraumatic EYES: Conjunctivae and EOM are normal. No  scleral icterus.  NECK: Normal range of motion, supple, no masses.  Normal thyroid.  SKIN: Skin is warm and dry. No rash noted. Not diaphoretic. No erythema. No pallor.  CARDIOVASCULAR: Normal heart rate noted, regular rhythm, no murmur. RESPIRATORY: Clear to auscultation bilaterally. Effort and breath sounds normal, no problems with respiration noted. BREASTS: Symmetric in size. No masses, skin changes, nipple drainage, or lymphadenopathy. Mammoplasty scars well-healed  ABDOMEN: Soft, no distention noted.  No tenderness, rebound or guarding. Midline laparotomy incision well-healed no hernias BLADDER: Normal and nontender PELVIC: (Unchanged from last visit)  External Genitalia: Normal  BUS: Normal  Vagina: Normal estrogen effect; first degree cystocele, Mild rectocele  Cervix: surgically absent  Uterus:surgically absent  Adnexa: Normal; nonpalpable and nontender  RV: External Exam NormaI, No Rectal Masses and Normal Sphincter tone  MUSCULOSKELETAL: Normal range of motion. No tenderness.  No cyanosis, clubbing, or edema. LYMPHATIC: No Axillary, Supraclavicular, or Inguinal Adenopathy.    Assessment:   Annual gynecologic examination 77 y.o. Contraception: status post hysterectomy bmi- 22 Status post anterior/posterior colporrhaphy with enterocele ligation; minimally symptomatic with incomplete bladder emptying and slow urinary stream; no evidence of worsening cystocele on exam (first-degree) Menopausal state, asymptomatic on ERT therapy; desires to continue  Plan:  Pap: Not needed Mammogram: Ordered Stool Guaiac Testing:  Ordered Labs: thru pcp Routine preventative health maintenance measures emphasized: Exercise/Diet/Weight control, Tobacco Warnings and Alcohol/Substance use risks  Premarin cream intravaginal twice a week refilled Estradiol 1/2 tab qd- refilled ERT counseling completed and patient accepts risks of continued use Return to Clinic - 1 Year   [employer], CMA   Herold Harms, MD   Note: This dictation was prepared with Dragon dictation along with smaller phrase technology. Any transcriptional errors that result from this process are unintentional.

## 2017-10-22 ENCOUNTER — Ambulatory Visit (INDEPENDENT_AMBULATORY_CARE_PROVIDER_SITE_OTHER): Payer: Medicare Other | Admitting: Obstetrics and Gynecology

## 2017-10-22 ENCOUNTER — Encounter: Payer: Self-pay | Admitting: Obstetrics and Gynecology

## 2017-10-22 VITALS — BP 156/63 | HR 67 | Ht 63.0 in | Wt 125.9 lb

## 2017-10-22 DIAGNOSIS — N816 Rectocele: Secondary | ICD-10-CM

## 2017-10-22 DIAGNOSIS — Z780 Asymptomatic menopausal state: Secondary | ICD-10-CM

## 2017-10-22 DIAGNOSIS — Z79890 Hormone replacement therapy (postmenopausal): Secondary | ICD-10-CM

## 2017-10-22 DIAGNOSIS — R339 Retention of urine, unspecified: Secondary | ICD-10-CM | POA: Diagnosis not present

## 2017-10-22 DIAGNOSIS — K469 Unspecified abdominal hernia without obstruction or gangrene: Secondary | ICD-10-CM

## 2017-10-22 DIAGNOSIS — Z90710 Acquired absence of both cervix and uterus: Secondary | ICD-10-CM

## 2017-10-22 DIAGNOSIS — Z1211 Encounter for screening for malignant neoplasm of colon: Secondary | ICD-10-CM

## 2017-10-22 DIAGNOSIS — Z1239 Encounter for other screening for malignant neoplasm of breast: Secondary | ICD-10-CM

## 2017-10-22 LAB — POCT URINALYSIS DIPSTICK
BILIRUBIN UA: NEGATIVE
GLUCOSE UA: NEGATIVE
Ketones, UA: NEGATIVE
LEUKOCYTES UA: NEGATIVE
Nitrite, UA: NEGATIVE
Odor: NEGATIVE
PH UA: 6.5 (ref 5.0–8.0)
Protein, UA: NEGATIVE
RBC UA: NEGATIVE
UROBILINOGEN UA: 0.2 U/dL

## 2017-10-22 MED ORDER — ESTROGENS, CONJUGATED 0.625 MG/GM VA CREA: TOPICAL_CREAM | VAGINAL | 3 refills | Status: DC

## 2017-10-22 NOTE — Patient Instructions (Signed)
1.  No Pap smear is needed. 2.  Mammogram is ordered 3.  Screening labs are to be obtained through primary care 4.  Stool guaiac card testing for colon cancer screening is ordered 5.  Continue with healthy eating, exercise. 6.  Continue with calcium and vitamin D supplementation 7.  Continue with Premarin cream intravaginal twice a week 8.  Continue with estradiol 0.5 mg daily 9.  Return in 1 year for annual exam 10.  Consider go girl cup for voiding issues when needed   Health Maintenance for Postmenopausal Women Menopause is a normal process in which your reproductive ability comes to an end. This process happens gradually over a span of months to years, usually between the ages of 9 and 19. Menopause is complete when you have missed 12 consecutive menstrual periods. It is important to talk with your health care provider about some of the most common conditions that affect postmenopausal women, such as heart disease, cancer, and bone loss (osteoporosis). Adopting a healthy lifestyle and getting preventive care can help to promote your health and wellness. Those actions can also lower your chances of developing some of these common conditions. What should I know about menopause? During menopause, you may experience a number of symptoms, such as:  Moderate-to-severe hot flashes.  Night sweats.  Decrease in sex drive.  Mood swings.  Headaches.  Tiredness.  Irritability.  Memory problems.  Insomnia.  Choosing to treat or not to treat menopausal changes is an individual decision that you make with your health care provider. What should I know about hormone replacement therapy and supplements? Hormone therapy products are effective for treating symptoms that are associated with menopause, such as hot flashes and night sweats. Hormone replacement carries certain risks, especially as you become older. If you are thinking about using estrogen or estrogen with progestin treatments,  discuss the benefits and risks with your health care provider. What should I know about heart disease and stroke? Heart disease, heart attack, and stroke become more likely as you age. This may be due, in part, to the hormonal changes that your body experiences during menopause. These can affect how your body processes dietary fats, triglycerides, and cholesterol. Heart attack and stroke are both medical emergencies. There are many things that you can do to help prevent heart disease and stroke:  Have your blood pressure checked at least every 1-2 years. High blood pressure causes heart disease and increases the risk of stroke.  If you are 100-66 years old, ask your health care provider if you should take aspirin to prevent a heart attack or a stroke.  Do not use any tobacco products, including cigarettes, chewing tobacco, or electronic cigarettes. If you need help quitting, ask your health care provider.  It is important to eat a healthy diet and maintain a healthy weight. ? Be sure to include plenty of vegetables, fruits, low-fat dairy products, and lean protein. ? Avoid eating foods that are high in solid fats, added sugars, or salt (sodium).  Get regular exercise. This is one of the most important things that you can do for your health. ? Try to exercise for at least 150 minutes each week. The type of exercise that you do should increase your heart rate and make you sweat. This is known as moderate-intensity exercise. ? Try to do strengthening exercises at least twice each week. Do these in addition to the moderate-intensity exercise.  Know your numbers.Ask your health care provider to check your cholesterol and  your blood glucose. Continue to have your blood tested as directed by your health care provider.  What should I know about cancer screening? There are several types of cancer. Take the following steps to reduce your risk and to catch any cancer development as early as  possible. Breast Cancer  Practice breast self-awareness. ? This means understanding how your breasts normally appear and feel. ? It also means doing regular breast self-exams. Let your health care provider know about any changes, no matter how small.  If you are 108 or older, have a clinician do a breast exam (clinical breast exam or CBE) every year. Depending on your age, family history, and medical history, it may be recommended that you also have a yearly breast X-ray (mammogram).  If you have a family history of breast cancer, talk with your health care provider about genetic screening.  If you are at high risk for breast cancer, talk with your health care provider about having an MRI and a mammogram every year.  Breast cancer (BRCA) gene test is recommended for women who have family members with BRCA-related cancers. Results of the assessment will determine the need for genetic counseling and BRCA1 and for BRCA2 testing. BRCA-related cancers include these types: ? Breast. This occurs in males or females. ? Ovarian. ? Tubal. This may also be called fallopian tube cancer. ? Cancer of the abdominal or pelvic lining (peritoneal cancer). ? Prostate. ? Pancreatic.  Cervical, Uterine, and Ovarian Cancer Your health care provider may recommend that you be screened regularly for cancer of the pelvic organs. These include your ovaries, uterus, and vagina. This screening involves a pelvic exam, which includes checking for microscopic changes to the surface of your cervix (Pap test).  For women ages 21-65, health care providers may recommend a pelvic exam and a Pap test every three years. For women ages 52-65, they may recommend the Pap test and pelvic exam, combined with testing for human papilloma virus (HPV), every five years. Some types of HPV increase your risk of cervical cancer. Testing for HPV may also be done on women of any age who have unclear Pap test results.  Other health care  providers may not recommend any screening for nonpregnant women who are considered low risk for pelvic cancer and have no symptoms. Ask your health care provider if a screening pelvic exam is right for you.  If you have had past treatment for cervical cancer or a condition that could lead to cancer, you need Pap tests and screening for cancer for at least 20 years after your treatment. If Pap tests have been discontinued for you, your risk factors (such as having a new sexual partner) need to be reassessed to determine if you should start having screenings again. Some women have medical problems that increase the chance of getting cervical cancer. In these cases, your health care provider may recommend that you have screening and Pap tests more often.  If you have a family history of uterine cancer or ovarian cancer, talk with your health care provider about genetic screening.  If you have vaginal bleeding after reaching menopause, tell your health care provider.  There are currently no reliable tests available to screen for ovarian cancer.  Lung Cancer Lung cancer screening is recommended for adults 34-65 years old who are at high risk for lung cancer because of a history of smoking. A yearly low-dose CT scan of the lungs is recommended if you:  Currently smoke.  Have a history  of at least 30 pack-years of smoking and you currently smoke or have quit within the past 15 years. A pack-year is smoking an average of one pack of cigarettes per day for one year.  Yearly screening should:  Continue until it has been 15 years since you quit.  Stop if you develop a health problem that would prevent you from having lung cancer treatment.  Colorectal Cancer  This type of cancer can be detected and can often be prevented.  Routine colorectal cancer screening usually begins at age 72 and continues through age 32.  If you have risk factors for colon cancer, your health care provider may recommend  that you be screened at an earlier age.  If you have a family history of colorectal cancer, talk with your health care provider about genetic screening.  Your health care provider may also recommend using home test kits to check for hidden blood in your stool.  A small camera at the end of a tube can be used to examine your colon directly (sigmoidoscopy or colonoscopy). This is done to check for the earliest forms of colorectal cancer.  Direct examination of the colon should be repeated every 5-10 years until age 64. However, if early forms of precancerous polyps or small growths are found or if you have a family history or genetic risk for colorectal cancer, you may need to be screened more often.  Skin Cancer  Check your skin from head to toe regularly.  Monitor any moles. Be sure to tell your health care provider: ? About any new moles or changes in moles, especially if there is a change in a mole's shape or color. ? If you have a mole that is larger than the size of a pencil eraser.  If any of your family members has a history of skin cancer, especially at a young age, talk with your health care provider about genetic screening.  Always use sunscreen. Apply sunscreen liberally and repeatedly throughout the day.  Whenever you are outside, protect yourself by wearing long sleeves, pants, a wide-brimmed hat, and sunglasses.  What should I know about osteoporosis? Osteoporosis is a condition in which bone destruction happens more quickly than new bone creation. After menopause, you may be at an increased risk for osteoporosis. To help prevent osteoporosis or the bone fractures that can happen because of osteoporosis, the following is recommended:  If you are 41-73 years old, get at least 1,000 mg of calcium and at least 600 mg of vitamin D per day.  If you are older than age 101 but younger than age 11, get at least 1,200 mg of calcium and at least 600 mg of vitamin D per day.  If you  are older than age 60, get at least 1,200 mg of calcium and at least 800 mg of vitamin D per day.  Smoking and excessive alcohol intake increase the risk of osteoporosis. Eat foods that are rich in calcium and vitamin D, and do weight-bearing exercises several times each week as directed by your health care provider. What should I know about how menopause affects my mental health? Depression may occur at any age, but it is more common as you become older. Common symptoms of depression include:  Low or sad mood.  Changes in sleep patterns.  Changes in appetite or eating patterns.  Feeling an overall lack of motivation or enjoyment of activities that you previously enjoyed.  Frequent crying spells.  Talk with your health care provider  if you think that you are experiencing depression. What should I know about immunizations? It is important that you get and maintain your immunizations. These include:  Tetanus, diphtheria, and pertussis (Tdap) booster vaccine.  Influenza every year before the flu season begins.  Pneumonia vaccine.  Shingles vaccine.  Your health care provider may also recommend other immunizations. This information is not intended to replace advice given to you by your health care provider. Make sure you discuss any questions you have with your health care provider. Document Released: 06/28/2005 Document Revised: 11/24/2015 Document Reviewed: 02/07/2015 Elsevier Interactive Patient Education  2018 [employer].

## 2017-10-24 LAB — URINE CULTURE

## 2017-11-25 LAB — FECAL OCCULT BLOOD, IMMUNOCHEMICAL: Fecal Occult Bld: NEGATIVE

## 2018-02-10 ENCOUNTER — Ambulatory Visit
Admission: RE | Admit: 2018-02-10 | Discharge: 2018-02-10 | Disposition: A | Discharge status: Home / Self Care | Payer: Medicare Other | Source: Ambulatory Visit | Attending: Obstetrics and Gynecology | Admitting: Obstetrics and Gynecology

## 2018-02-10 DIAGNOSIS — Z1231 Encounter for screening mammogram for malignant neoplasm of breast: Secondary | ICD-10-CM | POA: Diagnosis not present

## 2018-02-10 DIAGNOSIS — Z1239 Encounter for other screening for malignant neoplasm of breast: Secondary | ICD-10-CM

## 2018-02-11 ENCOUNTER — Other Ambulatory Visit: Payer: Self-pay | Admitting: Obstetrics and Gynecology

## 2018-02-11 DIAGNOSIS — R928 Other abnormal and inconclusive findings on diagnostic imaging of breast: Secondary | ICD-10-CM

## 2018-02-11 DIAGNOSIS — N6489 Other specified disorders of breast: Secondary | ICD-10-CM

## 2018-02-25 ENCOUNTER — Ambulatory Visit
Admission: RE | Admit: 2018-02-25 | Discharge: 2018-02-25 | Disposition: A | Discharge status: Home / Self Care | Payer: Medicare Other | Source: Ambulatory Visit | Attending: Obstetrics and Gynecology | Admitting: Obstetrics and Gynecology

## 2018-02-25 DIAGNOSIS — R928 Other abnormal and inconclusive findings on diagnostic imaging of breast: Secondary | ICD-10-CM

## 2018-02-25 DIAGNOSIS — N6489 Other specified disorders of breast: Secondary | ICD-10-CM

## 2018-03-01 ENCOUNTER — Other Ambulatory Visit: Payer: Self-pay | Admitting: Obstetrics and Gynecology

## 2018-10-27 ENCOUNTER — Encounter: Payer: Medicare Other | Admitting: Obstetrics and Gynecology

## 2018-10-27 ENCOUNTER — Encounter: Payer: Self-pay | Admitting: Obstetrics and Gynecology

## 2018-12-29 ENCOUNTER — Ambulatory Visit (INDEPENDENT_AMBULATORY_CARE_PROVIDER_SITE_OTHER): Payer: Medicare Other | Admitting: Obstetrics and Gynecology

## 2018-12-29 ENCOUNTER — Other Ambulatory Visit: Payer: Self-pay

## 2018-12-29 ENCOUNTER — Encounter: Payer: Self-pay | Admitting: Obstetrics and Gynecology

## 2018-12-29 VITALS — BP 152/76 | HR 90 | Ht 63.0 in | Wt 123.9 lb

## 2018-12-29 DIAGNOSIS — N952 Postmenopausal atrophic vaginitis: Secondary | ICD-10-CM

## 2018-12-29 DIAGNOSIS — Z1211 Encounter for screening for malignant neoplasm of colon: Secondary | ICD-10-CM

## 2018-12-29 DIAGNOSIS — R399 Unspecified symptoms and signs involving the genitourinary system: Secondary | ICD-10-CM

## 2018-12-29 DIAGNOSIS — Z780 Asymptomatic menopausal state: Secondary | ICD-10-CM

## 2018-12-29 DIAGNOSIS — Z1239 Encounter for other screening for malignant neoplasm of breast: Secondary | ICD-10-CM | POA: Diagnosis not present

## 2018-12-29 DIAGNOSIS — R339 Retention of urine, unspecified: Secondary | ICD-10-CM

## 2018-12-29 DIAGNOSIS — Z01419 Encounter for gynecological examination (general) (routine) without abnormal findings: Secondary | ICD-10-CM | POA: Diagnosis not present

## 2018-12-29 DIAGNOSIS — N816 Rectocele: Secondary | ICD-10-CM

## 2018-12-29 LAB — POCT URINALYSIS DIPSTICK
Bilirubin, UA: NEGATIVE
Blood, UA: NEGATIVE
Glucose, UA: NEGATIVE
Ketones, UA: NEGATIVE
Leukocytes, UA: NEGATIVE
Nitrite, UA: NEGATIVE
Protein, UA: NEGATIVE
Spec Grav, UA: 1.01 (ref 1.010–1.025)
Urobilinogen, UA: 0.2 E.U./dL
pH, UA: 6.5 (ref 5.0–8.0)

## 2018-12-29 NOTE — Progress Notes (Signed)
ANNUAL PREVENTATIVE CARE GYNECOLOGY  ENCOUNTER NOTE  Subjective:       Tracy Herman is a 78 y.o. [id number] female here for a routine annual gynecologic exam. The patient is not currently sexually active (notes husband has metastatic prostate cancer). The patient is not currently taking hormone replacement therapy (stopped in June as her prescription ran out).  Has not noticed any major hot flushes or night sweats. Does still use Premarin cream for local treatment. Patient denies post-menopausal vaginal bleeding. The patient wears seatbelts: yes. The patient participates in regular exercise: no (but does lots of gardening). Has the patient ever been transfused or tattooed?: no. The patient reports that there is not domestic violence in her life.  Current complaints: 1.  Notes dysuria and vaginal burning for the past 2 weeks.     Gynecologic History No LMP recorded. Patient has had a hysterectomy. Contraception: status post hysterectomy Last Pap: no longer needed. No history of abnormal pap smears.  Last mammogram: 02/2018. Results were: normal Last Colonoscopy:  Patient has never had a colonoscopy. Performs stool cards. Last Dexa Scan: Never had one.    Obstetric History OB History  Gravida Para Term Preterm AB Living  1 1 1     1   SAB TAB Ectopic Multiple Live Births          1    # Outcome Date GA Lbr Len/2nd Weight Sex Delivery Anes PTL Lv  1 Term [id number]    F Vag-Spont   LIV    Past Medical History:  Diagnosis Date  . AR (allergic rhinitis)   . Bursitis   . Cystocele   . Depression   . Hyperlipemia   . Hypertension   . Menopause   . PKD (polycystic kidney disease)   . Rectocele     Family History  Problem Relation Age of Onset  . Polycystic kidney disease Mother   . Polycystic kidney disease Sister   . Cancer Neg Hx   . Diabetes Neg Hx   . Heart disease Neg Hx     Past Surgical History:  Procedure Laterality Date  . ABDOMINAL HYSTERECTOMY  1975   TAH-  MENORRHAGIA - FIBROIDS  . ANTERIOR AND POSTERIOR VAGINAL REPAIR     WITH ENTEROCELE LIGATION  . APPENDECTOMY    . BLADDER SURGERY    . BREAST SURGERY     BILATERAL BREAST REDUCTION  . LAPAROTOMY     W.PARTIAL BOWEL RESECTION D/T BOWEL NECROSIS TO ADHESIONS  . REDUCTION MAMMAPLASTY Bilateral 1992    Social History   Socioeconomic History  . Marital status: Married    Spouse name: Not on file  . Number of children: Not on file  . Years of education: Not on file  . Highest education level: Not on file  Occupational History  . Not on file  Social Needs  . Financial resource strain: Not on file  . Food insecurity    Worry: Not on file    Inability: Not on file  . Transportation needs    Medical: Not on file    Non-medical: Not on file  Tobacco Use  . Smoking status: Former Smoker    Quit date: 1977    Years since quitting: 43.6  . Smokeless tobacco: Never Used  Substance and Sexual Activity  . Alcohol use: Yes    Comment: RED WINE QD  . Drug use: No  . Sexual activity: Not Currently    Birth control/protection: Surgical  Lifestyle  .  Physical activity    Days per week: 5 days    Minutes per session: 30 min  . Stress: Not on file  Relationships  . Social Musicianconnections    Talks on phone: Not on file    Gets together: Not on file    Attends religious service: Not on file    Active member of club or organization: Not on file    Attends meetings of clubs or organizations: Not on file    Relationship status: Not on file  . Intimate partner violence    Fear of current or ex partner: Not on file    Emotionally abused: Not on file    Physically abused: Not on file    Forced sexual activity: Not on file  Other Topics Concern  . Not on file  Social History Narrative  . Not on file    Current Outpatient Medications on File Prior to Visit  Medication Sig Dispense Refill  . aspirin EC 81 MG tablet Take by mouth.    . calcium carbonate 1250 MG capsule Take 1,250 mg by  mouth 2 (two) times daily with a meal.    . captopril (CAPOTEN) 25 MG tablet Take 25 mg by mouth 3 (three) times daily.    . cholecalciferol (VITAMIN D) 1000 units tablet Take 1,000 Units by mouth daily.    Marland Kitchen. conjugated estrogens (PREMARIN) vaginal cream Place vaginally 2 (two) times a week. 30 g 3  . cyanocobalamin 100 MCG tablet Take 100 mcg by mouth daily.    Marland Kitchen. estradiol (ESTRACE) 1 MG tablet TAKE 1 TABLET BY MOUTH EVERY DAY 90 tablet 0  . triamterene-hydrochlorothiazide (DYAZIDE) 50-25 MG capsule Take 1 capsule by mouth every morning.     No current facility-administered medications on file prior to visit.     Allergies  Allergen Reactions  . Codeine   . Zestril [Lisinopril]      Review of Systems ROS Review of Systems - General ROS: negative for - chills, fatigue, fever, hot flashes, night sweats, weight gain or weight loss.  Psychological ROS: negative for - anxiety, decreased libido, depression, mood swings, physical abuse or sexual abuse Ophthalmic ROS: negative for - blurry vision, eye pain or loss of vision ENT ROS: negative for - headaches, hearing change, visual changes or vocal changes Allergy and Immunology ROS: negative for - hives, itchy/watery eyes or seasonal allergies Hematological and Lymphatic ROS: negative for - bleeding problems, bruising, swollen lymph nodes or weight loss Endocrine ROS: negative for - galactorrhea, hair pattern changes, hot flashes, malaise/lethargy, mood swings, palpitations, polydipsia/polyuria, skin changes, temperature intolerance or unexpected weight changes Breast ROS: negative for - new or changing breast lumps or nipple discharge Respiratory ROS: negative for - cough or shortness of breath Cardiovascular ROS: negative for - chest pain, irregular heartbeat, palpitations or shortness of breath Gastrointestinal ROS: no abdominal pain, change in bowel habits, or black or bloody stools Genito-Urinary ROS: no trouble voiding, or hematuria.  Endorses positive vaginal burning and urinary discomfort.  Musculoskeletal ROS: negative for - joint pain or joint stiffness Neurological ROS: negative for - bowel and bladder control changes Dermatological ROS: negative for rash and skin lesion changes   Objective:   BP (!) 152/76   Pulse 90   Ht 5\' 3"  (1.6 m)   Wt 123 lb 14.4 oz (56.2 kg)   BMI 21.95 kg/m  CONSTITUTIONAL: Well-developed, well-nourished female in no acute distress.  PSYCHIATRIC: Normal mood and affect. Normal behavior. Normal judgment and thought content.  [place]: Alert and oriented to person, place, and time. Normal muscle tone coordination. No cranial nerve deficit noted. HENT:  Normocephalic, atraumatic, External right and left ear normal. Oropharynx is clear and moist EYES: Conjunctivae and EOM are normal. Pupils are equal, round, and reactive to light. No scleral icterus.  NECK: Normal range of motion, supple, no masses.  Normal thyroid.  SKIN: Skin is warm and dry. No rash noted. Not diaphoretic. No erythema. No pallor. CARDIOVASCULAR: Normal heart rate noted, regular rhythm, no murmur. RESPIRATORY: Clear to auscultation bilaterally. Effort and breath sounds normal, no problems with respiration noted. BREASTS: Symmetric in size. No masses, skin changes, nipple drainage, or lymphadenopathy. ABDOMEN: Soft, normal bowel sounds, no distention noted.  No tenderness, rebound or guarding.  BLADDER: Normal PELVIC:  Bladder no bladder distension noted  Urethra: normal appearing urethra with no masses, tenderness or lesions  Vulva: normal appearing vulva with no masses, tenderness or lesions  Vagina: atrophic, no vaginal discharge or tenderness. Small rectocele present.   Cervix: normal appearing cervix without discharge or lesions  Uterus: surgically absent, vaginal cuff well healed  Adnexa: normal adnexa in size, nontender and no masses  RV: External Exam NormaI, No Rectal Masses and Normal Sphincter tone   MUSCULOSKELETAL: Normal range of motion. No tenderness.  No cyanosis, clubbing, or edema.  2+ distal pulses. LYMPHATIC: No Axillary, Supraclavicular, or Inguinal Adenopathy.   Labs: Labs reviewed in Care Everywhere  Results for orders placed or performed in visit on 12/29/18  POCT urinalysis dipstick  Result Value Ref Range   Color, UA yellow    Clarity, UA clear    Glucose, UA Negative Negative   Bilirubin, UA neg    Ketones, UA neg    Spec Grav, UA 1.010 1.010 - 1.025   Blood, UA neg    pH, UA 6.5 5.0 - 8.0   Protein, UA Negative Negative   Urobilinogen, UA 0.2 0.2 or 1.0 E.U./dL   Nitrite, UA neg    Leukocytes, UA Negative Negative   Appearance yellow    Odor      Assessment:   Encounter for well woman exam with routine gynecological exam  Screening for breast cancer  UTI symptoms  Menopause  Rectocele Incomplete bladder emptying  Colon cancer screening  Vaginal atrophy  Plan:  Pap: Not needed Mammogram: Ordered.  Stool Guaiac Testing:  Ordered.  Patient cannot have colonoscopy due to prior bowel resection. Continue yearly FOBT. Labs: Done by PCP in March 2020. Reviewed in [place].  Routine preventative health maintenance measures emphasized: Exercise/Diet/Weight control, Tobacco Warnings, Alcohol/Substance use risks and Stress Management  Encouraged Vitamin D and Calcium intake for osteoporosis intervention.  Menopausal, discontinued use of systemic HRT in June, no symptoms.  Vaginal atrophy currently using Premarin cream twice weekly. Patient advised that she can either increase to 3 x weekly, or increase from 0.5 mg to 1 mg BID.  No evidence of UTI noted today.  Return to [place], MD [facility] Care

## 2018-12-29 NOTE — Patient Instructions (Signed)
Atrophic Vaginitis Atrophic vaginitis is a condition in which the tissues that line the vagina become dry and thin. This condition occurs in women who have stopped having their period. It is caused by a drop in a female hormone (estrogen). This hormone helps:  To keep the vagina moist.  To make a clear fluid. This clear fluid helps: ? To make the vagina ready for sex. ? To protect the vagina from infection. If the lining of the vagina is dry and thin, it may cause irritation, burning, or itchiness. It may also:  Make sex painful.  Make an exam of your vagina painful.  Cause bleeding.  Make you lose interest in sex.  Cause a burning feeling when you pee (urinate).  Cause a brown or yellow fluid to come from your vagina. Some women do not have symptoms. Follow these instructions at home: Medicines  Take over-the-counter and prescription medicines only as told by your doctor.  Do not use herbs or other medicines unless your doctor says it is okay.  Use medicines for for dryness. These include: ? Oils to make the vagina soft. ? Creams. ? Moisturizers. General instructions  Do not douche.  Do not use products that can make your vagina dry. These include: ? Scented sprays. ? Scented tampons. ? Scented soaps.  Sex can help increase blood flow and soften the tissue in the vagina. If it hurts to have sex: ? Tell your partner. ? Use products to make sex more comfortable. Use these only as told by your doctor. Contact a doctor if you:  Have discharge from the vagina that is different than usual.  Have a bad smell coming from your vagina.  Have new symptoms.  Do not get better.  Get worse. Summary  Atrophic vaginitis is a condition in which the lining of the vagina becomes dry and thin.  This condition affects women who have stopped having their periods.  Treatment may include using products that help make the vagina soft.  Call a doctor if do not get better with  treatment. This information is not intended to replace advice given to you by your health care provider. Make sure you discuss any questions you have with your health care provider. Document Released: 10/23/2007 Document Revised: 05/19/2017 Document Reviewed: 05/19/2017 Elsevier Patient Education  2020 [employer]   Health Maintenance for Postmenopausal Women Menopause is a normal process in which your ability to get pregnant comes to an end. This process happens slowly over many months or years, usually between the ages of 7548 and 9455. Menopause is complete when you have missed your menstrual periods for 12 months. It is important to talk with your health care provider about some of the most common conditions that affect women after menopause (postmenopausal women). These include heart disease, cancer, and bone loss (osteoporosis). Adopting a healthy lifestyle and getting preventive care can help to promote your health and wellness. The actions you take can also lower your chances of developing some of these common conditions. What should I know about menopause? During menopause, you may get a number of symptoms, such as:  Hot flashes. These can be moderate or severe.  Night sweats.  Decrease in sex drive.  Mood swings.  Headaches.  Tiredness.  Irritability.  Memory problems.  Insomnia. Choosing to treat or not to treat these symptoms is a decision that you make with your health care provider. Do I need hormone replacement therapy?  Hormone replacement therapy is effective in treating symptoms  that are caused by menopause, such as hot flashes and night sweats.  Hormone replacement carries certain risks, especially as you become older. If you are thinking about using estrogen or estrogen with progestin, discuss the benefits and risks with your health care provider. What is my risk for heart disease and stroke? The risk of heart disease, heart attack, and stroke increases as you  age. One of the causes may be a change in the body's hormones during menopause. This can affect how your body uses dietary fats, triglycerides, and cholesterol. Heart attack and stroke are medical emergencies. There are many things that you can do to help prevent heart disease and stroke. Watch your blood pressure  High blood pressure causes heart disease and increases the risk of stroke. This is more likely to develop in people who have high blood pressure readings, are of African descent, or are overweight.  Have your blood pressure checked: ? Every 3-5 years if you are 83-12 years of age. ? Every year if you are 59 years old or older. Eat a healthy diet   Eat a diet that includes plenty of vegetables, fruits, low-fat dairy products, and lean protein.  Do not eat a lot of foods that are high in solid fats, added sugars, or sodium. Get regular exercise Get regular exercise. This is one of the most important things you can do for your health. Most adults should:  Try to exercise for at least 150 minutes each week. The exercise should increase your heart rate and make you sweat (moderate-intensity exercise).  Try to do strengthening exercises at least twice each week. Do these in addition to the moderate-intensity exercise.  Spend less time sitting. Even light physical activity can be beneficial. Other tips  Work with your health care provider to achieve or maintain a healthy weight.  Do not use any products that contain nicotine or tobacco, such as cigarettes, e-cigarettes, and chewing tobacco. If you need help quitting, ask your health care provider.  Know your numbers. Ask your health care provider to check your cholesterol and your blood sugar (glucose). Continue to have your blood tested as directed by your health care provider. Do I need screening for cancer? Depending on your health history and family history, you may need to have cancer screening at different stages of your life.  This may include screening for:  Breast cancer.  Cervical cancer.  Lung cancer.  Colorectal cancer. What is my risk for osteoporosis? After menopause, you may be at increased risk for osteoporosis. Osteoporosis is a condition in which bone destruction happens more quickly than new bone creation. To help prevent osteoporosis or the bone fractures that can happen because of osteoporosis, you may take the following actions:  If you are 43-76 years old, get at least 1,000 mg of calcium and at least 600 mg of vitamin D per day.  If you are older than age 51 but younger than age 35, get at least 1,200 mg of calcium and at least 600 mg of vitamin D per day.  If you are older than age 25, get at least 1,200 mg of calcium and at least 800 mg of vitamin D per day. Smoking and drinking excessive alcohol increase the risk of osteoporosis. Eat foods that are rich in calcium and vitamin D, and do weight-bearing exercises several times each week as directed by your health care provider. How does menopause affect my mental health? Depression may occur at any age, but it is more  common as you become older. Common symptoms of depression include:  Low or sad mood.  Changes in sleep patterns.  Changes in appetite or eating patterns.  Feeling an overall lack of motivation or enjoyment of activities that you previously enjoyed.  Frequent crying spells. Talk with your health care provider if you think that you are experiencing depression. General instructions See your health care provider for regular wellness exams and vaccines. This may include:  Scheduling regular health, dental, and eye exams.  Getting and maintaining your vaccines. These include: ? Influenza vaccine. Get this vaccine each year before the flu season begins. ? Pneumonia vaccine. ? Shingles vaccine. ? Tetanus, diphtheria, and pertussis (Tdap) booster vaccine. Your health care provider may also recommend other immunizations. Tell  your health care provider if you have ever been abused or do not feel safe at home. Summary  Menopause is a normal process in which your ability to get pregnant comes to an end.  This condition causes hot flashes, night sweats, decreased interest in sex, mood swings, headaches, or lack of sleep.  Treatment for this condition may include hormone replacement therapy.  Take actions to keep yourself healthy, including exercising regularly, eating a healthy diet, watching your weight, and checking your blood pressure and blood sugar levels.  Get screened for cancer and depression. Make sure that you are up to date with all your vaccines. This information is not intended to replace advice given to you by your health care provider. Make sure you discuss any questions you have with your health care provider. Document Released: 06/28/2005 Document Revised: 04/29/2018 Document Reviewed: 04/29/2018 Elsevier Patient Education  2020 [employer]

## 2018-12-29 NOTE — Progress Notes (Signed)
Pt is present for annual exam. Pt stated that she has been having bladder issues x 2 weeks. Urinalysis done today.

## 2019-01-01 ENCOUNTER — Encounter: Payer: Self-pay | Admitting: Obstetrics and Gynecology

## 2019-02-19 ENCOUNTER — Ambulatory Visit
Admission: RE | Admit: 2019-02-19 | Discharge: 2019-02-19 | Disposition: A | Discharge status: Home / Self Care | Payer: Medicare Other | Source: Ambulatory Visit | Attending: Obstetrics and Gynecology | Admitting: Obstetrics and Gynecology

## 2019-02-19 DIAGNOSIS — Z1239 Encounter for other screening for malignant neoplasm of breast: Secondary | ICD-10-CM

## 2019-02-19 DIAGNOSIS — Z1231 Encounter for screening mammogram for malignant neoplasm of breast: Secondary | ICD-10-CM | POA: Diagnosis not present

## 2019-06-04 ENCOUNTER — Other Ambulatory Visit: Payer: Self-pay

## 2019-06-04 MED ORDER — PREMARIN 0.625 MG/GM VA CREA: TOPICAL_CREAM | VAGINAL | 3 refills | Status: DC

## 2019-12-30 ENCOUNTER — Encounter: Payer: Medicare Other | Admitting: Obstetrics and Gynecology

## 2020-01-27 NOTE — Patient Instructions (Addendum)
Breast Self-Awareness Breast self-awareness is knowing how your breasts look and feel. Doing breast self-awareness is important. It allows you to catch a breast problem early while it is still small and can be treated. All women should do breast self-awareness, including women who have had breast implants. Tell your doctor if you notice a change in your breasts. What you need:  A mirror.  A well-lit room. How to do a breast self-exam A breast self-exam is one way to learn what is normal for your breasts and to check for changes. To do a breast self-exam: Look for changes  1. Take off all the clothes above your waist. 2. Stand in front of a mirror in a room with good lighting. 3. Put your hands on your hips. 4. Push your hands down. 5. Look at your breasts and nipples in the mirror to see if one breast or nipple looks different from the other. Check to see if: ? The shape of one breast is different. ? The size of one breast is different. ? There are wrinkles, dips, and bumps in one breast and not the other. 6. Look at each breast for changes in the skin, such as: ? Redness. ? Scaly areas. 7. Look for changes in your nipples, such as: ? Liquid around the nipples. ? Bleeding. ? Dimpling. ? Redness. ? A change in where the nipples are. Feel for changes  1. Lie on your back on the floor. 2. Feel each breast. To do this, follow these steps: ? Pick a breast to feel. ? Put the arm closest to that breast above your head. ? Use your other arm to feel the nipple area of your breast. Feel the area with the pads of your three middle fingers by making small circles with your fingers. For the first circle, press lightly. For the second circle, press harder. For the third circle, press even harder. ? Keep making circles with your fingers at the different pressures as you move down your breast. Stop when you feel your ribs. ? Move your fingers a little toward the center of your body. ? Start  making circles with your fingers again, this time going up until you reach your collarbone. ? Keep making up-and-down circles until you reach your armpit. Remember to keep using the three pressures. ? Feel the other breast in the same way. 3. Sit or stand in the tub or shower. 4. With soapy water on your skin, feel each breast the same way you did in step 2 when you were lying on the floor. Write down what you find Writing down what you find can help you remember what to tell your doctor. Write down:  What is normal for each breast.  Any changes you find in each breast, including: ? The kind of changes you find. ? Whether you have pain. ? Size and location of any lumps.  When you last had your menstrual period. General tips  Check your breasts every month.  If you are breastfeeding, the best time to check your breasts is after you feed your baby or after you use a breast pump.  If you get menstrual periods, the best time to check your breasts is 5-7 days after your menstrual period is over.  With time, you will become comfortable with the self-exam, and you will begin to know if there are changes in your breasts. Contact a doctor if you:  See a change in the shape or size of your breasts or  nipples.  See a change in the skin of your breast or nipples, such as red or scaly skin.  Have fluid coming from your nipples that is not normal.  Find a lump or thick area that was not there before.  Have pain in your breasts.  Have any concerns about your breast health. Summary  Breast self-awareness includes looking for changes in your breasts, as well as feeling for changes within your breasts.  Breast self-awareness should be done in front of a mirror in a well-lit room.  You should check your breasts every month. If you get menstrual periods, the best time to check your breasts is 5-7 days after your menstrual period is over.  Let your doctor know of any changes you see in your  breasts, including changes in size, changes on the skin, pain or tenderness, or fluid from your nipples that is not normal. This information is not intended to replace advice given to you by your health care provider. Make sure you discuss any questions you have with your health care provider. Document Revised: 12/23/2017 Document Reviewed: 12/23/2017 Elsevier Patient Education  2020 [employer] Inc.  Urinary Incontinence  Urinary incontinence refers to a condition in which a person is unable to control where and when to pass urine. A person with this condition will urinate when he or she does not mean to (involuntarily). What are the causes? This condition may be caused by:  Medicines.  Infections.  Constipation.  Overactive bladder muscles.  Weak bladder muscles.  Weak pelvic floor muscles. These muscles provide support for the bladder, intestine, and, in women, the uterus.  Enlarged prostate in men. The prostate is a gland near the bladder. When it gets too big, it can pinch the urethra. With the urethra blocked, the bladder can weaken and lose the ability to empty properly.  Surgery.  Emotional factors, such as anxiety, stress, or post-traumatic stress disorder (PTSD).  Pelvic organ prolapse. This happens in women when organs shift out of place and into the vagina. This shift can prevent the bladder and urethra from working properly. What increases the risk? The following factors may make you more likely to develop this condition:  Older age.  Obesity and physical inactivity.  Pregnancy and childbirth.  Menopause.  Diseases that affect the nerves or spinal cord (neurological diseases).  Long-term (chronic) coughing. This can increase pressure on the bladder and pelvic floor muscles. What are the signs or symptoms? Symptoms may vary depending on the type of urinary incontinence you have. They include:  A sudden urge to urinate, but passing urine involuntarily before  you can get to a bathroom (urge incontinence).  Suddenly passing urine with any activity that forces urine to pass, such as coughing, laughing, exercise, or sneezing (stress incontinence).  Needing to urinate often, but urinating only a small amount, or constantly dribbling urine (overflow incontinence).  Urinating because you cannot get to the bathroom in time due to a physical disability, such as arthritis or injury, or communication and thinking problems, such as Alzheimer disease (functional incontinence). How is this diagnosed? This condition may be diagnosed based on:  Your medical history.  A physical exam.  Tests, such as: ? Urine tests. ? X-rays of your kidney and bladder. ? Ultrasound. ? CT scan. ? Cystoscopy. In this procedure, a health care provider inserts a tube with a light and camera (cystoscope) through the urethra and into the bladder in order to check for problems. ? Urodynamic testing. These tests assess  how well the bladder, urethra, and sphincter can store and release urine. There are different types of urodynamic tests, and they vary depending on what the test is measuring. To help diagnose your condition, your health care provider may recommend that you keep a log of when you urinate and how much you urinate. How is this treated? Treatment for this condition depends on the type of incontinence that you have and its cause. Treatment may include:  Lifestyle changes, such as: ? Quitting smoking. ? Maintaining a healthy weight. ? Staying active. Try to get 150 minutes of moderate-intensity exercise every week. Ask your health care provider which activities are safe for you. ? Eating a healthy diet.  Avoid high-fat foods, like fried foods.  Avoid refined carbohydrates like white bread and white rice.  Limit how much alcohol and caffeine you drink.  Increase your fiber intake. Foods such as fresh fruits, vegetables, beans, and whole grains are healthy sources of  fiber.  Pelvic floor muscle exercises.  Bladder training, such as lengthening the amount of time between bathroom breaks, or using the bathroom at regular intervals.  Using techniques to suppress bladder urges. This can include distraction techniques or controlled breathing exercises.  Medicines to relax the bladder muscles and prevent bladder spasms.  Medicines to help slow or prevent the growth of a man's prostate.  Botox injections. These can help relax the bladder muscles.  Using pulses of electricity to help change bladder reflexes (electrical nerve stimulation).  For women, using a medical device to prevent urine leaks. This is a small, tampon-like, disposable device that is inserted into the urethra.  Injecting collagen or carbon beads (bulking agents) into the urinary sphincter. These can help thicken tissue and close the bladder opening.  Surgery. Follow these instructions at home: Lifestyle  Limit alcohol and caffeine. These can fill your bladder quickly and irritate it.  Keep yourself clean to help prevent odors and skin damage. Ask your doctor about special skin creams and cleansers that can protect the skin from urine.  Consider wearing pads or adult diapers. Make sure to change them regularly, and always change them right after experiencing incontinence. General instructions  Take over-the-counter and prescription medicines only as told by your health care provider.  Use the bathroom about every 3-4 hours, even if you do not feel the need to urinate. Try to empty your bladder completely every time. After urinating, wait a minute. Then try to urinate again.  Make sure you are in a relaxed position while urinating.  If your incontinence is caused by nerve problems, keep a log of the medicines you take and the times you go to the bathroom.  Keep all follow-up visits as told by your health care provider. This is important. Contact a health care provider if:  You  have pain that gets worse.  Your incontinence gets worse. Get help right away if:  You have a fever or chills.  You are unable to urinate.  You have redness in your groin area or down your legs. Summary  Urinary incontinence refers to a condition in which a person is unable to control where and when to pass urine.  This condition may be caused by medicines, infection, weak bladder muscles, weak pelvic floor muscles, enlargement of the prostate (in men), or surgery.  The following factors increase your risk for developing this condition: older age, obesity, pregnancy and childbirth, menopause, neurological diseases, and chronic coughing.  There are several types of urinary incontinence. They  include urge incontinence, stress incontinence, overflow incontinence, and functional incontinence.  This condition is usually treated first with lifestyle and behavioral changes, such as quitting smoking, eating a healthier diet, and doing regular pelvic floor exercises. Other treatment options include medicines, bulking agents, medical devices, electrical nerve stimulation, or surgery. This information is not intended to replace advice given to you by your health care provider. Make sure you discuss any questions you have with your health care provider. Document Revised: 05/16/2017 Document Reviewed: 08/15/2016 Elsevier Patient Education  2020 [employer].

## 2020-01-27 NOTE — Progress Notes (Signed)
Pt present for annual exam. Pt stated that she was doing well other than having issues controlling and holding her bladder.

## 2020-01-28 ENCOUNTER — Ambulatory Visit (INDEPENDENT_AMBULATORY_CARE_PROVIDER_SITE_OTHER): Payer: Medicare PPO | Admitting: Obstetrics and Gynecology

## 2020-01-28 ENCOUNTER — Other Ambulatory Visit: Payer: Self-pay

## 2020-01-28 ENCOUNTER — Encounter: Payer: Self-pay | Admitting: Obstetrics and Gynecology

## 2020-01-28 VITALS — BP 145/89 | HR 72 | Ht 63.0 in | Wt 122.0 lb

## 2020-01-28 DIAGNOSIS — N819 Female genital prolapse, unspecified: Secondary | ICD-10-CM

## 2020-01-28 DIAGNOSIS — N3946 Mixed incontinence: Secondary | ICD-10-CM | POA: Diagnosis not present

## 2020-01-28 DIAGNOSIS — Z780 Asymptomatic menopausal state: Secondary | ICD-10-CM | POA: Diagnosis not present

## 2020-01-28 DIAGNOSIS — Z1231 Encounter for screening mammogram for malignant neoplasm of breast: Secondary | ICD-10-CM | POA: Diagnosis not present

## 2020-01-28 DIAGNOSIS — N952 Postmenopausal atrophic vaginitis: Secondary | ICD-10-CM

## 2020-01-28 DIAGNOSIS — Z01419 Encounter for gynecological examination (general) (routine) without abnormal findings: Secondary | ICD-10-CM | POA: Diagnosis not present

## 2020-01-28 NOTE — Progress Notes (Signed)
ANNUAL PREVENTATIVE CARE GYNECOLOGY  ENCOUNTER NOTE  Subjective:       Tracy Herman is a 79 y.o. G73P1001 female here for a routine annual gynecologic exam. The patient is not sexually active. The patient is no longer taking oral  taking hormone replacement therapy (stopped last year). She does still use Premarin cream for local treatment. Patient denies post-menopausal vaginal bleeding. The patient wears seatbelts: yes. The patient participates in regular exercise: no. Has the patient ever been transfused or tattooed?: no.   Current complaints: 1.  Notes complaints of urinary incontinence, unable to hold her bladder. She gets a sudden urge and then has to go to the restroom suddenly. Also notes having some leakage from coughing or sneezing.  Wears urinary pads.  Has remote history of "bladder tacking",   Gynecologic History No LMP recorded. Patient has had a hysterectomy. Contraception: status post hysterectomy Last Pap: no longer needed. No history of abnormal pap smears.  Last mammogram: 02/2018. Results were: normal Last Colonoscopy:  Patient has never had a colonoscopy. Performs stool cards. Last Dexa Scan: Never had one.    Obstetric History OB History  Gravida Para Term Preterm AB Living  1 1 1     1   SAB TAB Ectopic Multiple Live Births          1    # Outcome Date GA Lbr Len/2nd Weight Sex Delivery Anes PTL Lv  1 Term 96    F Vag-Spont   LIV    Past Medical History:  Diagnosis Date  . AR (allergic rhinitis)   . Bursitis   . Cystocele   . Depression   . Hyperlipemia   . Hypertension   . Menopause   . PKD (polycystic kidney disease)   . Rectocele     Family History  Problem Relation Age of Onset  . Polycystic kidney disease Mother   . Polycystic kidney disease Sister   . Cancer Neg Hx   . Diabetes Neg Hx   . Heart disease Neg Hx     Past Surgical History:  Procedure Laterality Date  . ABDOMINAL HYSTERECTOMY  1975   TAH- MENORRHAGIA - FIBROIDS  .  ANTERIOR AND POSTERIOR VAGINAL REPAIR     WITH ENTEROCELE LIGATION  . APPENDECTOMY    . BLADDER SURGERY    . BREAST SURGERY     BILATERAL BREAST REDUCTION  . LAPAROTOMY     W.PARTIAL BOWEL RESECTION D/T BOWEL NECROSIS TO ADHESIONS  . REDUCTION MAMMAPLASTY Bilateral 1992    Social History   Socioeconomic History  . Marital status: Married    Spouse name: Not on file  . Number of children: Not on file  . Years of education: Not on file  . Highest education level: Not on file  Occupational History  . Not on file  Tobacco Use  . Smoking status: Former Smoker    Quit date: 1977    Years since quitting: 44.7  . Smokeless tobacco: Never Used  Vaping Use  . Vaping Use: Never used  Substance and Sexual Activity  . Alcohol use: Yes    Comment: RED WINE QD  . Drug use: No  . Sexual activity: Not Currently    Birth control/protection: Surgical  Other Topics Concern  . Not on file  Social History Narrative  . Not on file   Social Determinants of Health   Financial Resource Strain:   . Difficulty of Paying Living Expenses: Not on file  Food Insecurity:   .  Worried About Programme researcher, broadcasting/film/video in the Last Year: Not on file  . Ran Out of Food in the Last Year: Not on file  Transportation Needs:   . Lack of Transportation (Medical): Not on file  . Lack of Transportation (Non-Medical): Not on file  Physical Activity:   . Days of Exercise per Week: Not on file  . Minutes of Exercise per Session: Not on file  Stress:   . Feeling of Stress : Not on file  Social Connections:   . Frequency of Communication with Friends and Family: Not on file  . Frequency of Social Gatherings with Friends and Family: Not on file  . Attends Religious Services: Not on file  . Active Member of Clubs or Organizations: Not on file  . Attends Banker Meetings: Not on file  . Marital Status: Not on file  Intimate Partner Violence:   . Fear of Current or Ex-Partner: Not on file  .  Emotionally Abused: Not on file  . Physically Abused: Not on file  . Sexually Abused: Not on file    Current Outpatient Medications on File Prior to Visit  Medication Sig Dispense Refill  . aspirin EC 81 MG tablet Take by mouth.    . calcium carbonate 1250 MG capsule Take 1,250 mg by mouth 2 (two) times daily with a meal.    . captopril (CAPOTEN) 25 MG tablet Take 25 mg by mouth 3 (three) times daily.    . cholecalciferol (VITAMIN D) 1000 units tablet Take 1,000 Units by mouth daily.    Marland Kitchen conjugated estrogens (PREMARIN) vaginal cream Place vaginally 2 (two) times a week. 30 g 3  . cyanocobalamin 100 MCG tablet Take 100 mcg by mouth daily.    Marland Kitchen triamterene-hydrochlorothiazide (DYAZIDE) 50-25 MG capsule Take 1 capsule by mouth every morning.    Marland Kitchen estradiol (ESTRACE) 1 MG tablet TAKE 1 TABLET BY MOUTH EVERY DAY (Patient not taking: Reported on 01/28/2020) 90 tablet 0   No current facility-administered medications on file prior to visit.    Allergies  Allergen Reactions  . Codeine   . Zestril [Lisinopril]      Review of Systems ROS Review of Systems - General ROS: negative for - chills, fatigue, fever, hot flashes, night sweats, weight gain or weight loss.  Psychological ROS: negative for - anxiety, decreased libido, depression, mood swings, physical abuse or sexual abuse Ophthalmic ROS: negative for - blurry vision, eye pain or loss of vision ENT ROS: negative for - headaches, hearing change, visual changes or vocal changes Allergy and Immunology ROS: negative for - hives, itchy/watery eyes or seasonal allergies Hematological and Lymphatic ROS: negative for - bleeding problems, bruising, swollen lymph nodes or weight loss Endocrine ROS: negative for - galactorrhea, hair pattern changes, hot flashes, malaise/lethargy, mood swings, palpitations, polydipsia/polyuria, skin changes, temperature intolerance or unexpected weight changes Breast ROS: negative for - new or changing breast lumps  or nipple discharge Respiratory ROS: negative for - cough or shortness of breath Cardiovascular ROS: negative for - chest pain, irregular heartbeat, palpitations or shortness of breath Gastrointestinal ROS: no abdominal pain, change in bowel habits, or black or bloody stools Genito-Urinary ROS: positive for trouble voiding (urine leakage). No vaginal discharge, or hematuria.  Musculoskeletal ROS: negative for - joint pain or joint stiffness Neurological ROS: negative for - bowel and bladder control changes Dermatological ROS: negative for rash and skin lesion changes   Objective:   BP (!) 145/89   Pulse 72  Ht 5\' 3"  (1.6 m)   Wt 122 lb (55.3 kg)   BMI 21.61 kg/m  CONSTITUTIONAL: Well-developed, well-nourished female in no acute distress.  PSYCHIATRIC: Normal mood and affect. Normal behavior. Normal judgment and thought content. NEUROLGIC: Alert and oriented to person, place, and time. Normal muscle tone coordination. No cranial nerve deficit noted. HENT:  Normocephalic, atraumatic, External right and left ear normal. Oropharynx is clear and moist EYES: Conjunctivae and EOM are normal. Pupils are equal, round, and reactive to light. No scleral icterus.  NECK: Normal range of motion, supple, no masses.  Normal thyroid.  SKIN: Skin is warm and dry. No rash noted. Not diaphoretic. No erythema. No pallor. CARDIOVASCULAR: Normal heart rate noted, regular rhythm, no murmur. RESPIRATORY: Clear to auscultation bilaterally. Effort and breath sounds normal, no problems with respiration noted. BREASTS: Symmetric in size. No masses, skin changes, nipple drainage, or lymphadenopathy. ABDOMEN: Soft, normal bowel sounds, no distention noted.  No tenderness, rebound or guarding.  BLADDER: Normal PELVIC:  Bladder no bladder distension noted  Urethra: normal appearing urethra with no masses, tenderness or lesions  Vulva: normal appearing vulva with no masses, tenderness or lesions  Vagina: atrophic,  no vaginal discharge or tenderness. Cystocele Grade 1-2, and small rectocele present.   Cervix: normal appearing cervix without discharge or lesions  Uterus: surgically absent, vaginal cuff well healed  Adnexa: normal adnexa in size, nontender and no masses  RV: External Exam NormaI, No Rectal Masses and Normal Sphincter tone  MUSCULOSKELETAL: Normal range of motion. No tenderness.  No cyanosis, clubbing, or edema.  2+ distal pulses. LYMPHATIC: No Axillary, Supraclavicular, or Inguinal Adenopathy.   Labs: Labs reviewed in Care Everywhere   Assessment:   Encounter for well woman exam with routine gynecological exam  Menopause  Cystocele with rectocele Urinary incontinence (mixed) Colon cancer screening  Vaginal atrophy  Plan:  - Pap: Not needed.  Beyond age of screening, and s/p hysterectomy - Mammogram: Not ordered. Patient beyond age of screening.  - Stool Guaiac Testing:  Ordered.  Patient cannot have colonoscopy due to prior bowel resection.  - Labs: Reviewed in Care Everywhere.  - Routine preventative health maintenance measures emphasized: Exercise/Diet/Weight control, Tobacco Warnings, Alcohol/Substance use risks and Stress Management  - Encouraged Vitamin D and Calcium intake for osteoporosis intervention.  - Menopausal, no longer using hormone replacement therapy.  - Vaginal atrophy, currently using Premarin cream twice weekly.  - Urinary incontinence - discussed options, including Kegel exercises, pessary, medications, or surgical intervention. Patient states that although symptoms are present, she does not desire surgery, or use of a pessary. Hesitant on use of medications. Will try exercises.  - Return to Clinic - 1 Year.    , MD [facility]

## 2020-01-29 ENCOUNTER — Encounter: Payer: Self-pay | Admitting: Obstetrics and Gynecology

## 2020-02-07 ENCOUNTER — Telehealth: Payer: Self-pay

## 2020-02-07 NOTE — Telephone Encounter (Signed)
Patient states she rode around till she found the place. She states she found our office

## 2020-02-07 NOTE — Telephone Encounter (Signed)
Error

## 2020-02-07 NOTE — Telephone Encounter (Signed)
Called and left a message for call back  

## 2020-02-07 NOTE — Telephone Encounter (Signed)
-----   Message from Wilnette Kales, [place] sent at 02/07/2020  1:27 PM EDT ----- Regarding: patient call [name],  Patient left voicemail saying she can not find the office in her GPS. If you could give her a call to explain how to come here. Patient can be reached at [phone].    Thanks, Jovon

## 2020-02-09 ENCOUNTER — Encounter: Payer: Self-pay | Admitting: Gastroenterology

## 2020-02-09 ENCOUNTER — Ambulatory Visit: Payer: Medicare PPO | Admitting: Gastroenterology

## 2020-02-09 ENCOUNTER — Other Ambulatory Visit: Payer: Self-pay

## 2020-02-09 ENCOUNTER — Telehealth: Payer: Self-pay

## 2020-02-09 VITALS — BP 150/86 | HR 66 | Temp 97.9°F | Ht 63.0 in | Wt 120.2 lb

## 2020-02-09 DIAGNOSIS — R140 Abdominal distension (gaseous): Secondary | ICD-10-CM

## 2020-02-09 DIAGNOSIS — R194 Change in bowel habit: Secondary | ICD-10-CM

## 2020-02-09 DIAGNOSIS — Z9049 Acquired absence of other specified parts of digestive tract: Secondary | ICD-10-CM | POA: Diagnosis not present

## 2020-02-09 MED ORDER — RIFAXIMIN 550 MG PO TABS: 550.0000 mg | ORAL_TABLET | Freq: Two times a day (BID) | ORAL | 0 refills | Status: AC

## 2020-02-09 NOTE — Progress Notes (Signed)
Arlyss Repress, MD [address]  [address]  [place], [place] [address]  Main: [phone]  Fax: [phone]    Gastroenterology Consultation  Referring Provider:     Kandyce Rud, MD Primary Care Physician:  Kandyce Rud, MD Primary Gastroenterologist:  Dr. Arlyss Repress Reason for Consultation:     Abdominal bloating, right upper quadrant pain        HPI:   Tracy Herman is a 79 y.o. female referred by Dr. Kandyce Rud, MD  for consultation & management of chronic abdominal bloating and right upper quadrant pain.  Patient had complicated surgical history, history of hysterectomy and appendectomy followed by necrosis of the bowel, partial bowel resection as well as bowel obstructions in the past.  She has chronic history of generalized abdominal bloating associated with upper abdominal discomfort, predominantly in the right upper quadrant region, bowel movements are variable consistency anywhere from movement for 2 days followed by explosive diarrhea, nonbloody.  Patient reports weight loss despite good appetite.  She denies nausea, vomiting.  She has a history of bilateral adult polycystic kidney disease and she reports that colonoscopy was attempted but not successful in 2017.  This was performed by Dr. Marva Panda. Patient denies drinking carbonated or sweetened tea or using artificial sweeteners  Patient is a retired Chartered loss adjuster.  Functionally independent with no major medical comorbidities.  Labs revealed normal CBC.  She has been undergoing fecal occult blood testing annually and it has been negative until 2019  NSAIDs: None  Antiplts/Anticoagulants/Anti thrombotics: None  GI Procedures: None  Past Medical History:  Diagnosis Date   AR (allergic rhinitis)    Bursitis    Cystocele    Depression    Hyperlipemia    Hypertension    Menopause    PKD (polycystic kidney disease)    Rectocele     Past Surgical History:  Procedure Laterality Date    ABDOMINAL HYSTERECTOMY  1975   TAH- MENORRHAGIA - FIBROIDS   ANTERIOR AND POSTERIOR VAGINAL REPAIR     WITH ENTEROCELE LIGATION   APPENDECTOMY     BLADDER SURGERY     BREAST SURGERY     BILATERAL BREAST REDUCTION   LAPAROTOMY     W.PARTIAL BOWEL RESECTION D/T BOWEL NECROSIS TO ADHESIONS   REDUCTION MAMMAPLASTY Bilateral 1992    Current Outpatient Medications:    aspirin EC 81 MG tablet, Take by mouth., Disp: , Rfl:    calcium carbonate 1250 MG capsule, Take 1,250 mg by mouth 2 (two) times daily with a meal., Disp: , Rfl:    captopril (CAPOTEN) 25 MG tablet, Take 25 mg by mouth 3 (three) times daily., Disp: , Rfl:    cholecalciferol (VITAMIN D) 1000 units tablet, Take 1,000 Units by mouth daily., Disp: , Rfl:    conjugated estrogens (PREMARIN) vaginal cream, Place vaginally 2 (two) times a week., Disp: 30 g, Rfl: 3   cyanocobalamin 100 MCG tablet, Take 100 mcg by mouth daily., Disp: , Rfl:    triamterene-hydrochlorothiazide (DYAZIDE) 50-25 MG capsule, Take 1 capsule by mouth every morning., Disp: , Rfl:    rifaximin (XIFAXAN) 550 MG TABS tablet, Take 1 tablet (550 mg total) by mouth 2 (two) times daily for 14 days., Disp: 28 tablet, Rfl: 0    Family History  Problem Relation Age of Onset   Polycystic kidney disease Mother    Polycystic kidney disease Sister    Cancer Neg Hx    Diabetes Neg Hx    Heart disease  Neg Hx      Social History   Tobacco Use   Smoking status: Former Smoker    Quit date: 1977    Years since quitting: 44.7   Smokeless tobacco: Never Used  Building services engineer Use: Never used  Substance Use Topics   Alcohol use: Yes    Comment: RED WINE QD   Drug use: No    Allergies as of 02/09/2020 - Review Complete 02/09/2020  Allergen Reaction Noted   Codeine  08/14/2015   Zestril [lisinopril]  08/14/2015    Review of Systems:    All systems reviewed and negative except where noted in HPI.   Physical Exam:  BP (!) 150/86 (BP  Location: Left Arm, Patient Position: Sitting, Cuff Size: Normal)    Pulse 66    Temp 97.9 F (36.6 C) (Oral)    Ht 5\' 3"  (1.6 m)    Wt 120 lb 4 oz (54.5 kg)    BMI 21.30 kg/m  No LMP recorded. Patient has had a hysterectomy.  General:   Alert, thin built, moderately nourished, pleasant and cooperative in NAD Head:  Normocephalic and atraumatic. Eyes:  Sclera clear, no icterus.   Conjunctiva pink. Ears:  Normal auditory acuity. Nose:  No deformity, discharge, or lesions. Mouth:  No deformity or lesions,oropharynx pink & moist. Neck:  Supple; no masses or thyromegaly. Lungs:  Respirations even and unlabored.  Clear throughout to auscultation.   No wheezes, crackles, or rhonchi. No acute distress. Heart:  Regular rate and rhythm; no murmurs, clicks, rubs, or gallops. Abdomen:  Normal bowel sounds. Soft, diffusely distended, tympanic to percussion, mild tenderness in the right upper quadrant area without masses, hepatosplenomegaly or hernias noted.  No guarding or rebound tenderness.   Rectal: Not performed Msk:  Symmetrical without gross deformities. Good, equal movement & strength bilaterally. Pulses:  Normal pulses noted. Extremities:  No clubbing or edema.  No cyanosis. Neurologic:  Alert and oriented x3;  grossly normal neurologically. Skin:  Intact without significant lesions or rashes. No jaundice. Psych:  Alert and cooperative. Normal mood and affect.  Imaging Studies: Reviewed  Assessment and Plan:   Tracy Herman is a 79 y.o. pleasant Caucasian female with multiple abdominal surgeries, appendectomy, complicated by necrosis of bowel, partial bowel resection, history of partial bowel obstructions is seen in consultation for chronic abdominal bloating and altered bowel habits, right upper abdominal discomfort  Recommend empiric treatment for bacterial overgrowth given her history of bowel resection and multiple surgeries Rifaximin 550 mg twice daily for 2 weeks If her symptoms are  persistent, recommend upper endoscopy as well as colonoscopy with pediatric colonoscope or upper endoscope for further evaluation given history of multiple abdominal surgeries   Follow up in virtual visit in 2 to 3 weeks   62, MD

## 2020-02-09 NOTE — Telephone Encounter (Signed)
Did prior authorization on the Xifaxan through cover my meds. Sent information to [employer]

## 2020-02-10 NOTE — Telephone Encounter (Signed)
Patient states she will come pick up medication in the morning

## 2020-02-10 NOTE — Telephone Encounter (Signed)
Insurance company denies the [employer]. Please advised

## 2020-02-10 NOTE — Telephone Encounter (Addendum)
Called and left a message for call back. We have put samples upfront in [place]

## 2020-02-10 NOTE — Telephone Encounter (Signed)
Let us try samples of rifaximin 550 mg p.o. 3 times daily at least for 10 days  RV

## 2020-03-31 ENCOUNTER — Other Ambulatory Visit: Payer: Self-pay | Admitting: Obstetrics and Gynecology

## 2020-03-31 DIAGNOSIS — Z1231 Encounter for screening mammogram for malignant neoplasm of breast: Secondary | ICD-10-CM

## 2020-04-18 ENCOUNTER — Ambulatory Visit: Payer: Medicare PPO

## 2020-06-26 ENCOUNTER — Ambulatory Visit
Admission: RE | Admit: 2020-06-26 | Discharge: 2020-06-26 | Disposition: A | Discharge status: Home / Self Care | Payer: Medicare PPO | Source: Ambulatory Visit | Attending: Obstetrics and Gynecology | Admitting: Obstetrics and Gynecology

## 2020-06-26 ENCOUNTER — Other Ambulatory Visit: Payer: Self-pay

## 2020-06-26 DIAGNOSIS — Z1231 Encounter for screening mammogram for malignant neoplasm of breast: Secondary | ICD-10-CM | POA: Insufficient documentation

## 2020-11-06 ENCOUNTER — Other Ambulatory Visit: Payer: Self-pay | Admitting: Surgery

## 2020-11-06 DIAGNOSIS — S76012A Strain of muscle, fascia and tendon of left hip, initial encounter: Secondary | ICD-10-CM

## 2020-11-09 ENCOUNTER — Ambulatory Visit
Admission: RE | Admit: 2020-11-09 | Discharge: 2020-11-09 | Disposition: A | Discharge status: Home / Self Care | Payer: Medicare PPO | Source: Ambulatory Visit | Attending: Surgery | Admitting: Surgery

## 2020-11-09 ENCOUNTER — Other Ambulatory Visit: Payer: Self-pay

## 2020-11-09 DIAGNOSIS — S76012A Strain of muscle, fascia and tendon of left hip, initial encounter: Secondary | ICD-10-CM | POA: Diagnosis present

## 2020-12-08 ENCOUNTER — Other Ambulatory Visit: Payer: Self-pay | Admitting: Surgery

## 2020-12-22 ENCOUNTER — Encounter
Admission: RE | Admit: 2020-12-22 | Discharge: 2020-12-22 | Disposition: A | Discharge status: Home / Self Care | Payer: Medicare PPO | Source: Ambulatory Visit | Attending: Surgery | Admitting: Surgery

## 2020-12-22 ENCOUNTER — Other Ambulatory Visit: Payer: Self-pay

## 2020-12-22 NOTE — Patient Instructions (Addendum)
Your procedure is scheduled on: 01/02/2021  Report to the Registration Desk on the 1st floor of the [facility]. To find out your arrival time, please call [phone] between 1PM - 3PM on: 01/01/2021   REMEMBER: Instructions that are not followed completely may result in serious medical risk, up to and including death; or upon the discretion of your surgeon and anesthesiologist your surgery may need to be rescheduled.  Do not eat food after midnight the night before surgery.  No gum chewing, lozengers or hard candies.  You may however, drink CLEAR liquids up to 2 hours before you are scheduled to arrive for your surgery. Do not drink anything within 2 hours of your scheduled arrival time.  Clear liquids include: - water  - apple juice without pulp - gatorade (not RED, PURPLE, OR BLUE) - black coffee or tea (Do NOT add milk or creamers to the coffee or tea) Do NOT drink anything that is not on this list.  In addition, your doctor has ordered for you to drink the provided  Ensure Pre-Surgery Clear Carbohydrate Drink -provided for you  Drinking this carbohydrate drink up to two hours before surgery helps to reduce insulin resistance and improve patient outcomes. Please complete drinking 2 hours prior to scheduled arrival time.   Theres no recommended medications you need to take on day of Surgery.    Follow recommendations from Cardiologist, Pulmonologist or PCP regarding stopping Aspirin, Coumadin, Plavix, Eliquis, Pradaxa, or Pletal.  One week prior to surgery: Stop Anti-inflammatories (NSAIDS) such as Advil, Aleve, Ibuprofen, Motrin, Naproxen, Naprosyn and Aspirin based products such as Excedrin, Goodys Powder, BC Powder. Stop ANY OVER THE COUNTER supplements until after surgery. Cholecalciferol (VITAMIN D), Cyanocobalamin 3000 MCG LOZG, pyridOXINE (VITAMIN B-6), You may however, continue to take Tylenol if needed for pain up until the day of surgery.  No Alcohol for 24 hours  before or after surgery.  No Smoking including e-cigarettes for 24 hours prior to surgery.  No chewable tobacco products for at least 6 hours prior to surgery.  No nicotine patches on the day of surgery.  Do not use any "recreational" drugs for at least a week prior to your surgery.  Please be advised that the combination of cocaine and anesthesia may have negative outcomes, up to and including death. If you test positive for cocaine, your surgery will be cancelled.  On the morning of surgery brush your teeth with toothpaste and water, you may rinse your mouth with mouthwash if you wish. Do not swallow any toothpaste or mouthwash.  Do not wear jewelry, make-up, hairpins, clips or nail polish.  Do not wear lotions, powders, or perfumes.   Do not shave body from the neck down 48 hours prior to surgery just in case you cut yourself which could leave a site for infection.  Also, freshly shaved skin may become irritated if using the CHG soap.  Contact lenses, hearing aids and dentures may not be worn into surgery.  Do not bring valuables to the hospital. [facility] is not responsible for any missing/lost belongings or valuables.   Use CHG Soap or wipes as directed on instruction sheet.    Notify your doctor if there is any change in your medical condition (cold, fever, infection).  Wear comfortable clothing (specific to your surgery type) to the hospital.  After surgery, you can help prevent lung complications by doing breathing exercises.  Take deep breaths and cough every 1-2 hours. Your doctor may order a device  called an Facilities manager to help you take deep breaths.  If you are being admitted to the hospital overnight, leave your suitcase in the car. After surgery it may be brought to your room.  If you are being discharged the day of surgery, you will not be allowed to drive home. You will need a responsible adult (18 years or older) to drive you home and stay with you  that night.   If you are taking public transportation, you will need to have a responsible adult (18 years or older) with you. Please confirm with your physician that it is acceptable to use public transportation.   Please call the Pre-admissions Testing Dept. at [phone] if you have any questions about these instructions.  Surgery Visitation Policy:  Patients undergoing a surgery or procedure may have one family member or support person with them as long as that person is not COVID-19 positive or experiencing its symptoms.  That person may remain in the waiting area during the procedure.  Inpatient Visitation:    Visiting hours are 7 a.m. to 8 p.m. Inpatients will be allowed two visitors daily. The visitors may change each day during the patient's stay. No visitors under the age of 1. Any visitor under the age of 24 must be accompanied by an adult. The visitor must pass COVID-19 screenings, use hand sanitizer when entering and exiting the patient's room and wear a mask at all times, including in the patient's room. Patients must also wear a mask when staff or their visitor are in the room. Masking is required regardless of vaccination status.

## 2020-12-26 ENCOUNTER — Encounter
Admission: RE | Admit: 2020-12-26 | Discharge: 2020-12-26 | Disposition: A | Discharge status: Home / Self Care | Payer: Medicare PPO | Source: Ambulatory Visit | Attending: Surgery | Admitting: Surgery

## 2020-12-26 ENCOUNTER — Other Ambulatory Visit: Payer: Self-pay

## 2020-12-26 DIAGNOSIS — Z01810 Encounter for preprocedural cardiovascular examination: Secondary | ICD-10-CM | POA: Insufficient documentation

## 2020-12-27 ENCOUNTER — Other Ambulatory Visit: Payer: Self-pay

## 2020-12-27 ENCOUNTER — Emergency Department: Payer: Medicare PPO

## 2020-12-27 ENCOUNTER — Observation Stay
Admission: EM | Admit: 2020-12-27 | Discharge: 2020-12-29 | Disposition: A | Discharge status: Home / Self Care | Payer: Medicare PPO | Attending: Internal Medicine | Admitting: Internal Medicine

## 2020-12-27 DIAGNOSIS — M25561 Pain in right knee: Secondary | ICD-10-CM

## 2020-12-27 DIAGNOSIS — Z20822 Contact with and (suspected) exposure to covid-19: Secondary | ICD-10-CM | POA: Insufficient documentation

## 2020-12-27 DIAGNOSIS — I10 Essential (primary) hypertension: Secondary | ICD-10-CM | POA: Diagnosis present

## 2020-12-27 DIAGNOSIS — Z79899 Other long term (current) drug therapy: Secondary | ICD-10-CM | POA: Diagnosis not present

## 2020-12-27 DIAGNOSIS — Y92009 Unspecified place in unspecified non-institutional (private) residence as the place of occurrence of the external cause: Secondary | ICD-10-CM | POA: Diagnosis not present

## 2020-12-27 DIAGNOSIS — S82401A Unspecified fracture of shaft of right fibula, initial encounter for closed fracture: Secondary | ICD-10-CM

## 2020-12-27 DIAGNOSIS — S89301A Unspecified physeal fracture of lower end of right fibula, initial encounter for closed fracture: Principal | ICD-10-CM | POA: Insufficient documentation

## 2020-12-27 DIAGNOSIS — S99911A Unspecified injury of right ankle, initial encounter: Secondary | ICD-10-CM | POA: Diagnosis present

## 2020-12-27 DIAGNOSIS — W1839XA Other fall on same level, initial encounter: Secondary | ICD-10-CM | POA: Diagnosis not present

## 2020-12-27 DIAGNOSIS — M25551 Pain in right hip: Secondary | ICD-10-CM

## 2020-12-27 DIAGNOSIS — R55 Syncope and collapse: Secondary | ICD-10-CM | POA: Diagnosis present

## 2020-12-27 DIAGNOSIS — Z7982 Long term (current) use of aspirin: Secondary | ICD-10-CM | POA: Insufficient documentation

## 2020-12-27 DIAGNOSIS — Y9301 Activity, walking, marching and hiking: Secondary | ICD-10-CM | POA: Insufficient documentation

## 2020-12-27 DIAGNOSIS — S8261XA Displaced fracture of lateral malleolus of right fibula, initial encounter for closed fracture: Secondary | ICD-10-CM

## 2020-12-27 DIAGNOSIS — S82831A Other fracture of upper and lower end of right fibula, initial encounter for closed fracture: Secondary | ICD-10-CM

## 2020-12-27 DIAGNOSIS — Z87891 Personal history of nicotine dependence: Secondary | ICD-10-CM | POA: Insufficient documentation

## 2020-12-27 DIAGNOSIS — R2689 Other abnormalities of gait and mobility: Secondary | ICD-10-CM | POA: Diagnosis not present

## 2020-12-27 LAB — CBC
HCT: 35 % — ABNORMAL LOW (ref 36.0–46.0)
Hemoglobin: 12.6 g/dL (ref 12.0–15.0)
MCH: 32.6 pg (ref 26.0–34.0)
MCHC: 36 g/dL (ref 30.0–36.0)
MCV: 90.4 fL (ref 80.0–100.0)
Platelets: 213 10*3/uL (ref 150–400)
RBC: 3.87 MIL/uL (ref 3.87–5.11)
RDW: 12.7 % (ref 11.5–15.5)
WBC: 9.1 10*3/uL (ref 4.0–10.5)
nRBC: 0 % (ref 0.0–0.2)

## 2020-12-27 LAB — URINALYSIS, COMPLETE (UACMP) WITH MICROSCOPIC
Bacteria, UA: NONE SEEN
Bilirubin Urine: NEGATIVE
Glucose, UA: NEGATIVE mg/dL
Hgb urine dipstick: NEGATIVE
Ketones, ur: 5 mg/dL — AB
Nitrite: NEGATIVE
Protein, ur: NEGATIVE mg/dL
Specific Gravity, Urine: 1.014 (ref 1.005–1.030)
pH: 6 (ref 5.0–8.0)

## 2020-12-27 LAB — BASIC METABOLIC PANEL
Anion gap: 8 (ref 5–15)
BUN: 18 mg/dL (ref 8–23)
CO2: 26 mmol/L (ref 22–32)
Calcium: 9.5 mg/dL (ref 8.9–10.3)
Chloride: 98 mmol/L (ref 98–111)
Creatinine, Ser: 0.76 mg/dL (ref 0.44–1.00)
GFR, Estimated: >60 mL/min (ref >60)
Glucose, Bld: 118 mg/dL — ABNORMAL HIGH (ref 70–99)
Potassium: 3.9 mmol/L (ref 3.5–5.1)
Sodium: 132 mmol/L — ABNORMAL LOW (ref 135–145)

## 2020-12-27 LAB — RESP PANEL BY RT-PCR (FLU A&B, COVID) ARPGX2
Influenza A by PCR: NEGATIVE
Influenza B by PCR: NEGATIVE
SARS Coronavirus 2 by RT PCR: NEGATIVE

## 2020-12-27 MED ORDER — VITAMIN B-12 1000 MCG PO TABS
3000.0000 ug | ORAL_TABLET | Freq: Every day | ORAL | Status: DC
  Administered 2020-12-28 – 2020-12-29 (×2): 3000 ug via ORAL
  Filled 2020-12-27 (×2): qty 3

## 2020-12-27 MED ORDER — SODIUM CHLORIDE 0.9% FLUSH
3.0000 mL | Freq: Two times a day (BID) | INTRAVENOUS | Status: DC
  Administered 2020-12-27 – 2020-12-29 (×4): 3 mL via INTRAVENOUS

## 2020-12-27 MED ORDER — CALCIUM CARBONATE 1250 (500 CA) MG PO TABS
1.0000 | ORAL_TABLET | Freq: Every day | ORAL | Status: DC
  Administered 2020-12-28 – 2020-12-29 (×2): 500 mg via ORAL
  Filled 2020-12-27 (×3): qty 1

## 2020-12-27 MED ORDER — VITAMIN B-6 50 MG PO TABS
50.0000 mg | ORAL_TABLET | Freq: Every day | ORAL | Status: DC
  Administered 2020-12-28 – 2020-12-29 (×2): 50 mg via ORAL
  Filled 2020-12-27 (×2): qty 1

## 2020-12-27 MED ORDER — IBUPROFEN 400 MG PO TABS
600.0000 mg | ORAL_TABLET | Freq: Four times a day (QID) | ORAL | Status: DC | PRN
  Administered 2020-12-27 – 2020-12-28 (×2): 600 mg via ORAL
  Filled 2020-12-27: qty 1
  Filled 2020-12-27: qty 2

## 2020-12-27 MED ORDER — TRIAMTERENE-HCTZ 37.5-25 MG PO TABS
1.0000 | ORAL_TABLET | ORAL | Status: DC
  Administered 2020-12-28: 1 via ORAL
  Filled 2020-12-27: qty 1

## 2020-12-27 MED ORDER — VITAMIN D 25 MCG (1000 UNIT) PO TABS
1000.0000 [IU] | ORAL_TABLET | Freq: Every day | ORAL | Status: DC
  Administered 2020-12-28 – 2020-12-29 (×2): 1000 [IU] via ORAL
  Filled 2020-12-27 (×2): qty 1

## 2020-12-27 MED ORDER — CAPTOPRIL 25 MG PO TABS
25.0000 mg | ORAL_TABLET | Freq: Three times a day (TID) | ORAL | Status: DC
  Administered 2020-12-27 – 2020-12-29 (×5): 25 mg via ORAL
  Filled 2020-12-27 (×8): qty 1

## 2020-12-27 MED ORDER — ENOXAPARIN SODIUM 40 MG/0.4ML IJ SOSY
40.0000 mg | PREFILLED_SYRINGE | INTRAMUSCULAR | Status: DC
  Administered 2020-12-27 – 2020-12-28 (×2): 40 mg via SUBCUTANEOUS
  Filled 2020-12-27 (×2): qty 0.4

## 2020-12-27 MED ORDER — HYDROCODONE-ACETAMINOPHEN 5-325 MG PO TABS
1.0000 | ORAL_TABLET | ORAL | Status: DC | PRN
  Administered 2020-12-27 – 2020-12-29 (×4): 1 via ORAL
  Filled 2020-12-27 (×4): qty 1

## 2020-12-27 MED ORDER — SENNA 8.6 MG PO TABS
1.0000 | ORAL_TABLET | Freq: Two times a day (BID) | ORAL | Status: DC
  Filled 2020-12-27 (×2): qty 1

## 2020-12-27 NOTE — H&P (Addendum)
History and Physical    Tracy Herman WCB:[id number] DOB: 08-16-1940 DOA: 12/27/2020  PCP: Kandyce Rud, MD  Patient coming from: Home  I have personally briefly reviewed patient's old medical records in [facility]  Chief Complaint: Syncope  HPI: Tracy Herman is a 80 y.o. female with medical history significant for HTN, HLD, polycystic kidney disease, chronic left gluteus medius tendon tear who presents with syncope and loss of consciousness.  She reports that today she was fixing breakfast and was walking across the room to her chair.  Then everything blacked out and she fell face first onto the ground.  She felt dizzy right before the episode.  Her husband picked her up and sat her in her chair and she came back to consciousness.  Had right ankle pain.  Denies any chest pain or palpitation.  No shortness of breath.  States something similar happened about several years ago but was never worked up.  She denies any new medications or any changes to her current medication.  She states that she has small bowel bacterial overgrowth and has chronic diarrhea.  Last episode was 2 days ago but reports being able to stay hydrated.  Normally ambulate without assistance. 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   Denies tobacco or illicit drug use.  Drinks 1 glass of wine per night.  ED Course: She was afebrile, with one reading of hypotension with systolic 98. BG of 118.  CBC unremarkable. EKG with low voltage but no specific ST-T wave changes.  Right ankle x-ray showing displaced fibular malleolus fracture.  Review of Systems: Constitutional: No Weight Change, No Fever ENT/Mouth: No sore throat, No Rhinorrhea Eyes: No Eye Pain, No Vision Changes Cardiovascular: No Chest Pain, no SOB, No PND, No Dyspnea on Exertion, No Orthopnea, No Edema, No Palpitations Respiratory: No Cough, No Sputum Gastrointestinal: No Nausea, No Vomiting, + Diarrhea,  No Constipation, No Pain Genitourinary: no Urinary Incontinence, No Urgency, No Flank Pain Musculoskeletal: No Arthralgias, No Myalgias Skin: No Skin Lesions, No Pruritus, Neuro: no Weakness, No Numbness,  + Loss of Consciousness, + Syncope Psych: No Anxiety/Panic, No Depression, no decrease appetite Heme/Lymph: No Bruising, No Bleeding  Past Medical History:  Diagnosis Date   AR (allergic rhinitis)    Bursitis    Cystocele    Depression    Hyperlipemia    Hypertension    Menopause    PKD (polycystic kidney disease)    Rectocele     Past Surgical History:  Procedure Laterality Date   ABDOMINAL HYSTERECTOMY  1975   TAH- MENORRHAGIA - FIBROIDS   ANTERIOR AND POSTERIOR VAGINAL REPAIR     WITH ENTEROCELE LIGATION   APPENDECTOMY     BLADDER SURGERY     BREAST SURGERY     BILATERAL BREAST REDUCTION   LAPAROTOMY     W.PARTIAL BOWEL RESECTION D/T BOWEL NECROSIS TO ADHESIONS   REDUCTION MAMMAPLASTY Bilateral 1992     reports that she quit smoking about 45 years ago. Her smoking use included cigarettes. She has never used smokeless tobacco. She reports current alcohol use. She reports that she does not use drugs. Social History  Allergies  Allergen Reactions   Codeine     Does not recall a reaction   Zestril [Lisinopril]     depression    Family History  Problem Relation Age of Onset   Polycystic kidney disease Mother    Polycystic kidney disease  Sister    Cancer Neg Hx    Diabetes Neg Hx    Heart disease Neg Hx      Prior to Admission medications   Medication Sig Start Date End Date Taking? Authorizing Provider  acetaminophen (TYLENOL) 500 MG tablet Take 500 mg by mouth every 6 (six) hours as needed for moderate pain.    [provider]  aspirin EC 81 MG tablet Take 81 mg by mouth every 6 (six) hours as needed for moderate pain.    [provider]  calcium carbonate (TUMS - DOSED IN MG ELEMENTAL CALCIUM) 500 MG chewable tablet Chew 1 tablet by  mouth daily.    [provider]  captopril (CAPOTEN) 25 MG tablet Take 25 mg by mouth 3 (three) times daily.    [provider]  cholecalciferol (VITAMIN D) 1000 units tablet Take 1,000 Units by mouth daily.    [provider]  Cyanocobalamin 3000 MCG LOZG Take 3,000 mcg by mouth daily.    [provider]  hydrocortisone cream 1 % Apply 1 application topically daily as needed for itching.    [provider]  pyridOXINE (VITAMIN B-6) 50 MG tablet Take 50 mg by mouth daily.    [provider]  triamterene-hydrochlorothiazide (DYAZIDE) 37.5-25 MG capsule Take 1 capsule by mouth every other day.    [provider]    Physical Exam: Vitals:   12/27/20 1411 12/27/20 1412 12/27/20 1624  BP:  98/66 (!) 144/67  Pulse:  73 65  Resp: 18  18  Temp: 98.6 F (37 C)  97.9 F (36.6 C)  TempSrc: Oral  Oral  SpO2:  98% 98%  Weight: 51.7 kg    Height: 5' (1.524 m)      Constitutional: NAD, calm, comfortable, elderly female sitting upright in bed Vitals:   12/27/20 1411 12/27/20 1412 12/27/20 1624  BP:  98/66 (!) 144/67  Pulse:  73 65  Resp: 18  18  Temp: 98.6 F (37 C)  97.9 F (36.6 C)  TempSrc: Oral  Oral  SpO2:  98% 98%  Weight: 51.7 kg    Height: 5' (1.524 m)     Eyes: PERRL, lids and conjunctivae normal ENMT: Mucous membranes are moist.  Neck: normal, supple Respiratory: clear to auscultation bilaterally, no wheezing, no crackles. Normal respiratory effort. No accessory muscle use.  Cardiovascular: Regular rate and rhythm, no murmurs / rubs / gallops. No extremity edema. 2+ pedal pulses.   Abdomen: no tenderness, no masses palpated. . Bowel sounds positive.  Musculoskeletal: no clubbing / cyanosis. No joint deformity upper and lower extremities.  Normal muscle tone. Split on right ankle. Skin: no rashes, lesions, ulcers. No induration Neurologic: CN 2-12 grossly intact. Sensation intact, Strength 5/5 in all 4.   Psychiatric: Normal judgment and insight. Alert and oriented x 3. Normal mood.     Labs on Admission: I have personally reviewed following labs and imaging studies  CBC: Recent Labs  Lab 12/27/20 1414  WBC 9.1  HGB 12.6  HCT 35.0*  MCV 90.4  PLT 213   Basic Metabolic Panel: Recent Labs  Lab 12/27/20 1414  NA 132*  K 3.9  CL 98  CO2 26  GLUCOSE 118*  BUN 18  CREATININE 0.76  CALCIUM 9.5   GFR: Estimated Creatinine Clearance: 41.6 mL/min (by C-G formula based on SCr of 0.76 mg/dL). Liver Function Tests: No results for input(s): AST, ALT, ALKPHOS, BILITOT, PROT, ALBUMIN in the last 168 hours. No results for  input(s): LIPASE, AMYLASE in the last 168 hours. No results for input(s): AMMONIA in the last 168 hours. Coagulation Profile: No results for input(s): INR, PROTIME in the last 168 hours. Cardiac Enzymes: No results for input(s): CKTOTAL, CKMB, CKMBINDEX, TROPONINI in the last 168 hours. BNP (last 3 results) No results for input(s): PROBNP in the last 8760 hours. HbA1C: No results for input(s): HGBA1C in the last 72 hours. CBG: No results for input(s): GLUCAP in the last 168 hours. Lipid Profile: No results for input(s): CHOL, HDL, LDLCALC, TRIG, CHOLHDL, LDLDIRECT in the last 72 hours. Thyroid Function Tests: No results for input(s): TSH, T4TOTAL, FREET4, T3FREE, THYROIDAB in the last 72 hours. Anemia Panel: No results for input(s): VITAMINB12, FOLATE, FERRITIN, TIBC, IRON, RETICCTPCT in the last 72 hours. Urine analysis:    Component Value Date/Time   COLORURINE YELLOW (A) 12/27/2020 1414   APPEARANCEUR CLEAR (A) 12/27/2020 1414   APPEARANCEUR Clear 11/26/2011 0511   LABSPEC 1.014 12/27/2020 1414   LABSPEC 1.015 11/26/2011 0511   PHURINE 6.0 12/27/2020 1414   GLUCOSEU NEGATIVE 12/27/2020 1414   GLUCOSEU Negative 11/26/2011 0511   HGBUR NEGATIVE 12/27/2020 1414   BILIRUBINUR NEGATIVE 12/27/2020 1414   BILIRUBINUR neg 12/29/2018 1523   BILIRUBINUR  Negative 11/26/2011 0511   KETONESUR 5 (A) 12/27/2020 1414   PROTEINUR NEGATIVE 12/27/2020 1414   UROBILINOGEN 0.2 12/29/2018 1523   NITRITE NEGATIVE 12/27/2020 1414   LEUKOCYTESUR TRACE (A) 12/27/2020 1414   LEUKOCYTESUR Negative 11/26/2011 0511    Radiological Exams on Admission: DG Ankle Complete Right  Result Date: 12/27/2020 CLINICAL DATA:  Injury lateral ankle pain EXAM: RIGHT ANKLE - COMPLETE 3+ VIEW COMPARISON:  None. FINDINGS: Acute minimally displaced fracture involving lateral fibular malleolus with overlying soft tissue swelling. Ankle mortise is symmetric. There is soft tissue swelling. Fixating screw in the fifth metatarsal IMPRESSION: Acute minimally displaced fibular malleolar fracture Electronically Signed   By: Jasmine Pang M.D.   On: 12/27/2020 15:11      Assessment/Plan  Syncope with loss of consciousness -had one reading of borderline BP with systolic of 90s but doubt accuracy since all other BP readings were elevated -get orthostatic vital signs -Continuous telemetry -Obtain echocardiogram -check TSH   Right displaced fibular mallear fracture s/p fall -EDP discussed with orthopedics Dr. Martha Clan who agrees to follow patient in the outpatient setting as he states there is nothing acute to do at this point except a splint and then a cast in the office  HTN -continue captopril and Triamterene-HCTZ  Chronic left gluteus medius tendon tear -has planned surgery with orthopedic Dr. Joice Lofts on 8/16  DVT prophylaxis:.lovenox Code Status: Full Family Communication: Plan discussed with patient at bedside  disposition Plan: Home with observation Consults called:  Admission status: Observation     Level of care: Med-Surg  Status is: Observation  The patient remains OBS appropriate and will d/c before 2 midnights.  Dispo: The patient is from: Home              Anticipated d/c is to: Home              Patient currently is not medically stable to d/c.    Difficult to place patient No         Anselm Jungling DO [facility]   If 7PM-7AM, please contact night-coverage [email or web address]   12/27/2020, 6:59 PM

## 2020-12-27 NOTE — ED Notes (Signed)
Patient provided a dinner tray - MD notified that the patient requested to have a vegetarian diet.

## 2020-12-27 NOTE — ED Provider Notes (Signed)
[facility] Emergency Department Provider Note   ____________________________________________   Event Date/Time   First MD Initiated Contact with Patient 12/27/20 1801     (approximate)  I have reviewed the triage vital signs and the nursing notes.   HISTORY  Chief Complaint Loss of Consciousness and Ankle Pain    HPI Tracy Herman is a 80 y.o. female who presents for an episode of syncope and right ankle pain  LOCATION: Right lateral ankle DURATION: Just prior to arrival TIMING: Stable since onset SEVERITY: 9/10 QUALITY: Aching CONTEXT: Patient states that she was cooking breakfast this morning when she went to walk into her living room and blacked out ending up on the floor and complaining of severe right ankle pain MODIFYING FACTORS: Any movement of the right ankle worsens this pain and is partially relieved at rest ASSOCIATED SYMPTOMS: Denies   Per medical record review, left hip muscular injury recently          Past Medical History:  Diagnosis Date   AR (allergic rhinitis)    Bursitis    Cystocele    Depression    Hyperlipemia    Hypertension    Menopause    PKD (polycystic kidney disease)    Rectocele     Patient Active Problem List   Diagnosis Date Noted   Syncope 12/27/2020   Incomplete bladder emptying 10/22/2017   Menopause 10/22/2016   Status post hysterectomy 10/22/2016   Allergic rhinitis 01/17/2014   Benign essential hypertension 01/17/2014   Other and unspecified hyperlipidemia 01/17/2014    Past Surgical History:  Procedure Laterality Date   ABDOMINAL HYSTERECTOMY  1975   TAH- MENORRHAGIA - FIBROIDS   ANTERIOR AND POSTERIOR VAGINAL REPAIR     WITH ENTEROCELE LIGATION   APPENDECTOMY     BLADDER SURGERY     BREAST SURGERY     BILATERAL BREAST REDUCTION   LAPAROTOMY     W.PARTIAL BOWEL RESECTION D/T BOWEL NECROSIS TO ADHESIONS   REDUCTION MAMMAPLASTY Bilateral 1992    Prior to Admission medications    Medication Sig Start Date End Date Taking? Authorizing Provider  acetaminophen (TYLENOL) 500 MG tablet Take 500 mg by mouth every 6 (six) hours as needed for moderate pain.    [provider]  aspirin EC 81 MG tablet Take 81 mg by mouth every 6 (six) hours as needed for moderate pain.    [provider]  calcium carbonate (TUMS - DOSED IN MG ELEMENTAL CALCIUM) 500 MG chewable tablet Chew 1 tablet by mouth daily.    [provider]  captopril (CAPOTEN) 25 MG tablet Take 25 mg by mouth 3 (three) times daily.    [provider]  cholecalciferol (VITAMIN D) 1000 units tablet Take 1,000 Units by mouth daily.    [provider]  Cyanocobalamin 3000 MCG LOZG Take 3,000 mcg by mouth daily.    [provider]  hydrocortisone cream 1 % Apply 1 application topically daily as needed for itching.    [provider]  pyridOXINE (VITAMIN B-6) 50 MG tablet Take 50 mg by mouth daily.    [provider]  triamterene-hydrochlorothiazide (DYAZIDE) 37.5-25 MG capsule Take 1 capsule by mouth every other day.    [provider]    Allergies Codeine and Zestril [lisinopril]  Family History  Problem Relation Age of Onset   Polycystic kidney disease Mother    Polycystic kidney disease Sister    Cancer Neg Hx    Diabetes Neg  Hx    Heart disease Neg Hx     Social History Social History   Tobacco Use   Smoking status: Former    Types: Cigarettes    Quit date: 1977    Years since quitting: 45.6   Smokeless tobacco: Never  Vaping Use   Vaping Use: Never used  Substance Use Topics   Alcohol use: Yes    Comment: RED WINE QD   Drug use: No    Review of Systems Constitutional: No fever/chills Eyes: No visual changes. ENT: No sore throat. Cardiovascular: Denies chest pain. Respiratory: Denies shortness of breath. Gastrointestinal: No abdominal pain.  No nausea, no vomiting.  No diarrhea. Genitourinary: Negative for  dysuria. Musculoskeletal: Positive for acute right ankle pain Skin: Negative for rash. Neurological: Negative for headaches, weakness/numbness/paresthesias in any extremity Psychiatric: Negative for suicidal ideation/homicidal ideation   ____________________________________________   PHYSICAL EXAM:  VITAL SIGNS: ED Triage Vitals  Enc Vitals Group     BP 12/27/20 1412 98/66     Pulse Rate 12/27/20 1412 73     Resp 12/27/20 1411 18     Temp 12/27/20 1411 98.6 F (37 C)     Temp Source 12/27/20 1411 Oral     SpO2 12/27/20 1412 98 %     Weight 12/27/20 1411 114 lb (51.7 kg)     Height 12/27/20 1411 5' (1.524 m)     Head Circumference --      Peak Flow --      Pain Score 12/27/20 1411 9     Pain Loc --      Pain Edu? --      Excl. in GC? --    Constitutional: Alert and oriented. Well appearing and in no acute distress. Eyes: Conjunctivae are normal. PERRL. Head: Atraumatic. Nose: No congestion/rhinnorhea. Mouth/Throat: Mucous membranes are moist. Neck: No stridor Cardiovascular: Grossly normal heart sounds.  Good peripheral circulation. Respiratory: Normal respiratory effort.  No retractions. Gastrointestinal: Soft and nontender. No distention. Musculoskeletal: Swelling with tenderness to palpation over the lateral malleolus and limited range of motion secondary to pain Neurologic:  Normal speech and language. No gross focal neurologic deficits are appreciated. Skin:  Skin is warm and dry. No rash noted. Psychiatric: Mood and affect are normal. Speech and behavior are normal.  ____________________________________________   LABS (all labs ordered are listed, but only abnormal results are displayed)  Labs Reviewed  BASIC METABOLIC PANEL - Abnormal; Notable for the following components:      Result Value   Sodium 132 (*)    Glucose, Bld 118 (*)    All other components within normal limits  CBC - Abnormal; Notable for the following components:   HCT 35.0 (*)    All  other components within normal limits  URINALYSIS, COMPLETE (UACMP) WITH MICROSCOPIC - Abnormal; Notable for the following components:   Color, Urine YELLOW (*)    APPearance CLEAR (*)    Ketones, ur 5 (*)    Leukocytes,Ua TRACE (*)    All other components within normal limits  RESP PANEL BY RT-PCR (FLU A&B, COVID) [id number]   ____________________________________________  EKG  ED ECG REPORT I, Merwyn Katos, the attending physician, personally viewed and interpreted this ECG.  Date: 12/27/2020 EKG Time: 1415 Rate: 74 Rhythm: normal sinus rhythm QRS Axis: normal Intervals: normal ST/T Wave abnormalities: normal Narrative Interpretation: no evidence of acute ischemia  ____________________________________________  RADIOLOGY  ED MD interpretation: Three-view x-ray of the right ankle shows an acute minimally displaced  fibular malleolus fracture  Official radiology report(s): DG Ankle Complete Right  Result Date: 12/27/2020 CLINICAL DATA:  Injury lateral ankle pain EXAM: RIGHT ANKLE - COMPLETE 3+ VIEW COMPARISON:  None. FINDINGS: Acute minimally displaced fracture involving lateral fibular malleolus with overlying soft tissue swelling. Ankle mortise is symmetric. There is soft tissue swelling. Fixating screw in the fifth metatarsal IMPRESSION: Acute minimally displaced fibular malleolar fracture Electronically Signed   By: Jasmine Pang M.D.   On: 12/27/2020 15:11    ____________________________________________   PROCEDURES  Procedure(s) performed (including Critical Care):  .1-3 Lead EKG Interpretation  Date/Time: 12/27/2020 7:12 PM Performed by: Merwyn Katos, MD Authorized by: Merwyn Katos, MD     Interpretation: normal     ECG rate:  66   ECG rate assessment: normal     Rhythm: sinus rhythm     Ectopy: none     Conduction: normal     ____________________________________________   INITIAL IMPRESSION / ASSESSMENT AND PLAN / ED COURSE  As part of my medical  decision making, I reviewed the following data within the electronic medical record, if available:  Nursing notes reviewed and incorporated, Labs reviewed, EKG interpreted, Old chart reviewed, Radiograph reviewed and Notes from prior ED visits reviewed and incorporated      Patient presents with complaints of syncope/presyncope ED Workup:  CBC, BMP, Troponin, BNP, ECG, CXR Differential diagnosis includes HF, ICH, seizure, stroke, HOCM, ACS, aortic dissection, malignant arrhythmia, or GI bleed. Findings: No evidence of acute laboratory abnormalities.  Troponin negative x1 EKG: No e/o STEMI. No evidence of Brugadas sign, delta wave, epsilon wave, significantly prolonged QTc, or malignant arrhythmia. Consults: Spoke with Dr. Martha Clan in orthopedics who agrees to follow patient in the outpatient setting as he states there is nothing acute to do at this point except a splint and then a cast in the office Disposition: Admit to medicine, telemetry bed for cardiac monitoring and cardiology review.      ____________________________________________   FINAL CLINICAL IMPRESSION(S) / ED DIAGNOSES  Final diagnoses:  Closed fracture of distal end of right fibula, unspecified fracture morphology, initial encounter  Syncope and collapse     ED Discharge Orders     None        Note:  This document was prepared using Dragon voice recognition software and may include unintentional dictation errors.    Merwyn Katos, MD 12/28/20 Jerene Bears

## 2020-12-27 NOTE — ED Triage Notes (Signed)
Pt to ED for right ankle injury after having syncopal episode this morning and being assisted to floor by husband. Denies hitting head.  States felt dizzy this morning, denies at this time

## 2020-12-28 ENCOUNTER — Observation Stay: Admit: 2020-12-28 | Payer: Medicare PPO

## 2020-12-28 ENCOUNTER — Encounter: Payer: Self-pay | Admitting: Family Medicine

## 2020-12-28 ENCOUNTER — Observation Stay: Payer: Medicare PPO

## 2020-12-28 ENCOUNTER — Observation Stay
Admit: 2020-12-28 | Discharge: 2020-12-28 | Disposition: A | Discharge status: Home / Self Care | Payer: Medicare PPO | Attending: Family Medicine | Admitting: Family Medicine

## 2020-12-28 DIAGNOSIS — R55 Syncope and collapse: Secondary | ICD-10-CM | POA: Diagnosis not present

## 2020-12-28 LAB — ECHOCARDIOGRAM COMPLETE
AR max vel: 1.33 cm2
AV Area VTI: 1.43 cm2
AV Area mean vel: 1.16 cm2
AV Mean grad: 2 mmHg
AV Peak grad: 3.9 mmHg
Ao pk vel: 0.98 m/s
Area-P 1/2: 2.62 cm2
Height: 60 in
S' Lateral: 2.6 cm
Weight: 1824 oz

## 2020-12-28 LAB — TSH: TSH: 1.855 u[IU]/mL (ref 0.350–4.500)

## 2020-12-28 MED ORDER — ONDANSETRON HCL 4 MG/2ML IJ SOLN
INTRAMUSCULAR | Status: AC
  Filled 2020-12-28: qty 2

## 2020-12-28 MED ORDER — ONDANSETRON HCL 4 MG/2ML IJ SOLN
4.0000 mg | Freq: Four times a day (QID) | INTRAMUSCULAR | Status: DC | PRN
  Administered 2020-12-28: 4 mg via INTRAVENOUS

## 2020-12-28 NOTE — Progress Notes (Signed)
PROGRESS NOTE    Tracy Herman  JXB:[id number] DOB: 1940/10/25 DOA: 12/27/2020 PCP: Kandyce Rud, MD   Brief Narrative:   80 y.o. female with medical history significant for HTN, HLD, polycystic kidney disease, chronic left gluteus medius tendon tear who presents with syncope and loss of consciousness.   She reports that today she was fixing breakfast and was walking across the room to her chair.  Then everything blacked out and she fell face first onto the ground.  She felt dizzy right before the episode.  Her husband picked her up and sat her in her chair and she came back to consciousness.  Had right ankle pain.  Denies any chest pain or palpitation.  No shortness of breath.  States something similar happened about several years ago but was never worked up.  She denies any new medications or any changes to her current medication.   She states that she has small bowel bacterial overgrowth and has chronic diarrhea.  Last episode was 2 days ago but reports being able to stay hydrated.   Normally ambulate without assistance.   Assessment & Plan:   Principal Problem:   Syncope Active Problems:   Benign essential hypertension   Right fibular fracture  Syncope Unclear etiology Suspect vasovagal Patient had a prodrome of dizziness prior to syncopal event Plan: Follow-up echocardiogram Continue telemetry Orthostatic vitals Therapy evaluations Home health  Fibular malleolus fracture right Chronic left gluteus medius tendon tear EDP consulted Dr. Martha Clan Nonsurgical management Splint applied at bedside Patient had pain in right hip Plan: Patient had planned orthopedic surgery with Dr. Joice Lofts on 8/16.  Per Dr. Samuel Germany note he will discuss with OT service to coordinate timing of this operation.  In the meantime will continue with therapy evaluations, home health, pain control as needed, splints to right lower extremity in house then follow-up in office for cast.  Essential  hypertension PTA captopril PTA triamterene-hydrochlorothiazide  DVT prophylaxis: SQ Lovenox Code Status: Full Family Communication: None today Disposition Plan: Status is: Observation  The patient remains OBS appropriate and will d/c before 2 midnights.  Dispo: The patient is from: Home              Anticipated d/c is to: Home              Patient currently is not medically stable to d/c.   Difficult to place patient No  Fibular fracture: Nonoperative management.  Pain control, syncope work-up.  Anticipated date of discharge 12/29/2020     Level of care: Med-Surg  Consultants:  Orthopedic surgery  Procedures:  None  Antimicrobials: None   Subjective: Patient seen and examined.  Endorses nausea this morning.  Pain in right hip.  No other complaints  Objective: Vitals:   12/27/20 2101 12/27/20 2328 12/28/20 0431 12/28/20 0802  BP: (!) 157/71 (!) 148/57 (!) 128/59 119/67  Pulse: 74 65 (!) 54 61  Resp: 18 18 18 16   Temp: 98 F (36.7 C) 98 F (36.7 C) 98 F (36.7 C) 99 F (37.2 C)  TempSrc: Oral  Oral Oral  SpO2: 98% 98% 97% 98%  Weight:      Height:        Intake/Output Summary (Last 24 hours) at 12/28/2020 1200 Last data filed at 12/28/2020 1013 Gross per 24 hour  Intake 0 ml  Output --  Net 0 ml   Filed Weights   12/27/20 1411  Weight: 51.7 kg    Examination:  General exam: Appears calm and  comfortable  Respiratory system: Clear to auscultation. Respiratory effort normal. Cardiovascular system: S1 & S2 heard, RRR. No JVD, murmurs, rubs, gallops or clicks. No pedal edema. Gastrointestinal system: Abdomen is nondistended, soft and nontender. No organomegaly or masses felt. Normal bowel sounds heard. Central nervous system: Alert and oriented. No focal neurological deficits. Extremities: Pain on palpation of right hip.  Right lower extremity decreased range of motion. Skin: No rashes, lesions or ulcers Psychiatry: Judgement and insight appear normal.  Mood & affect appropriate.     Data Reviewed: I have personally reviewed following labs and imaging studies  CBC: Recent Labs  Lab 12/27/20 1414  WBC 9.1  HGB 12.6  HCT 35.0*  MCV 90.4  PLT 213   Basic Metabolic Panel: Recent Labs  Lab 12/27/20 1414  NA 132*  K 3.9  CL 98  CO2 26  GLUCOSE 118*  BUN 18  CREATININE 0.76  CALCIUM 9.5   GFR: Estimated Creatinine Clearance: 41.6 mL/min (by C-G formula based on SCr of 0.76 mg/dL). Liver Function Tests: No results for input(s): AST, ALT, ALKPHOS, BILITOT, PROT, ALBUMIN in the last 168 hours. No results for input(s): LIPASE, AMYLASE in the last 168 hours. No results for input(s): AMMONIA in the last 168 hours. Coagulation Profile: No results for input(s): INR, PROTIME in the last 168 hours. Cardiac Enzymes: No results for input(s): CKTOTAL, CKMB, CKMBINDEX, TROPONINI in the last 168 hours. BNP (last 3 results) No results for input(s): PROBNP in the last 8760 hours. HbA1C: No results for input(s): HGBA1C in the last 72 hours. CBG: No results for input(s): GLUCAP in the last 168 hours. Lipid Profile: No results for input(s): CHOL, HDL, LDLCALC, TRIG, CHOLHDL, LDLDIRECT in the last 72 hours. Thyroid Function Tests: Recent Labs    12/28/20 0421  TSH 1.855   Anemia Panel: No results for input(s): VITAMINB12, FOLATE, FERRITIN, TIBC, IRON, RETICCTPCT in the last 72 hours. Sepsis Labs: No results for input(s): PROCALCITON, LATICACIDVEN in the last 168 hours.  Recent Results (from the past 240 hour(s))  Resp Panel by RT-PCR (Flu A&B, Covid) Nasopharyngeal Swab     Status: None   Collection Time: 12/27/20  6:51 PM   Specimen: Nasopharyngeal Swab; Nasopharyngeal(NP) swabs in vial transport medium  Result Value Ref Range Status   SARS Coronavirus 2 by RT PCR NEGATIVE NEGATIVE Final    Comment: (NOTE) SARS-CoV-2 target nucleic acids are NOT DETECTED.  The SARS-CoV-2 RNA is generally detectable in upper  respiratory specimens during the acute phase of infection. The lowest concentration of SARS-CoV-2 viral copies this assay can detect is 138 copies/mL. A negative result does not preclude SARS-Cov-2 infection and should not be used as the sole basis for treatment or other patient management decisions. A negative result may occur with  improper specimen collection/handling, submission of specimen other than nasopharyngeal swab, presence of viral mutation(s) within the areas targeted by this assay, and inadequate number of viral copies(<138 copies/mL). A negative result must be combined with clinical observations, patient history, and epidemiological information. The expected result is Negative.  Fact Sheet for Patients:  BloggerCourse.com  Fact Sheet for Healthcare Providers:  SeriousBroker.it  This test is no t yet approved or cleared by the [place] FDA and  has been authorized for detection and/or diagnosis of SARS-CoV-2 by FDA under an Emergency Use Authorization (EUA). This EUA will remain  in effect (meaning this test can be used) for the duration of the COVID-19 declaration under Section 564(b)(1) of the Act, 21 U.S.C.section  360bbb-3(b)(1), unless the authorization is terminated  or revoked sooner.       Influenza A by PCR NEGATIVE NEGATIVE Final   Influenza B by PCR NEGATIVE NEGATIVE Final    Comment: (NOTE) The Xpert Xpress SARS-CoV-2/FLU/RSV plus assay is intended as an aid in the diagnosis of influenza from Nasopharyngeal swab specimens and should not be used as a sole basis for treatment. Nasal washings and aspirates are unacceptable for Xpert Xpress SARS-CoV-2/FLU/RSV testing.  Fact Sheet for Patients: BloggerCourse.com  Fact Sheet for Healthcare Providers: SeriousBroker.it  This test is not yet approved or cleared by the [place] FDA and has been  authorized for detection and/or diagnosis of SARS-CoV-2 by FDA under an Emergency Use Authorization (EUA). This EUA will remain in effect (meaning this test can be used) for the duration of the COVID-19 declaration under Section 564(b)(1) of the Act, 21 U.S.C. section 360bbb-3(b)(1), unless the authorization is terminated or revoked.  Performed at [facility], [address], [place], [place] [address]          Radiology Studies: DG Ankle Complete Right  Result Date: 12/27/2020 CLINICAL DATA:  Injury lateral ankle pain EXAM: RIGHT ANKLE - COMPLETE 3+ VIEW COMPARISON:  None. FINDINGS: Acute minimally displaced fracture involving lateral fibular malleolus with overlying soft tissue swelling. Ankle mortise is symmetric. There is soft tissue swelling. Fixating screw in the fifth metatarsal IMPRESSION: Acute minimally displaced fibular malleolar fracture Electronically Signed   By: Jasmine Pang M.D.   On: 12/27/2020 15:11        Scheduled Meds:  calcium carbonate  1 tablet Oral Q breakfast   captopril  25 mg Oral TID   cholecalciferol  1,000 Units Oral Daily   enoxaparin (LOVENOX) injection  40 mg Subcutaneous Q24H   ondansetron       pyridOXINE  50 mg Oral Daily   senna  1 tablet Oral BID   sodium chloride flush  3 mL Intravenous Q12H   triamterene-hydrochlorothiazide  1 tablet Oral QODAY   vitamin B-12  3,000 mcg Oral Daily   Continuous Infusions:   LOS: 0 days    Time spent: 25 minutes    Tresa Sanseverino, MD [facility] Pager [phone]  If 7PM-7AM, please contact night-coverage 12/28/2020, 12:00 PM

## 2020-12-28 NOTE — Consult Note (Signed)
ORTHOPAEDIC CONSULTATION  REQUESTING PHYSICIAN: Tresa Vaccaro, MD  Chief Complaint: Right hip and ankle pain status post syncopal fall  HPI: Tracy Herman is a 80 y.o. female who complains of right hip and ankle pain after a fall secondary to syncope yesterday.  Patient has a history of a chronic left gluteus medius tear which was due to be repaired by Dr. Joice Lofts next week.  Patient was diagnosed with a nondisplaced lateral malleolus fracture by x-ray in the ER yesterday.  Patient was placed in an ASO splint.  She was admitted for work-up of her syncope.  Orthopedics is consulted for management of her ankle fracture.  Patient states she had right ankle pain yesterday immediately after her fall.  Her right hip is hurting her today and was significantly painful when she got out of bed to use the bedside commode.  Past Medical History:  Diagnosis Date   AR (allergic rhinitis)    Bursitis    Cystocele    Depression    Hyperlipemia    Hypertension    Menopause    PKD (polycystic kidney disease)    Rectocele    Past Surgical History:  Procedure Laterality Date   ABDOMINAL HYSTERECTOMY  1975   TAH- MENORRHAGIA - FIBROIDS   ANTERIOR AND POSTERIOR VAGINAL REPAIR     WITH ENTEROCELE LIGATION   APPENDECTOMY     BLADDER SURGERY     BREAST SURGERY     BILATERAL BREAST REDUCTION   LAPAROTOMY     W.PARTIAL BOWEL RESECTION D/T BOWEL NECROSIS TO ADHESIONS   REDUCTION MAMMAPLASTY Bilateral 1992   Social History   Socioeconomic History   Marital status: Married    Spouse name: Not on file   Number of children: Not on file   Years of education: Not on file   Highest education level: Not on file  Occupational History   Not on file  Tobacco Use   Smoking status: Former    Types: Cigarettes    Quit date: 38    Years since quitting: 45.6   Smokeless tobacco: Never  Vaping Use   Vaping Use: Never used  Substance and Sexual Activity   Alcohol use: Yes    Comment: RED WINE QD    Drug use: No   Sexual activity: Not Currently    Birth control/protection: Surgical  Other Topics Concern   Not on file  Social History Narrative   Lives with hubby .   Social Determinants of Health   Financial Resource Strain: Not on file  Food Insecurity: Not on file  Transportation Needs: Not on file  Physical Activity: Not on file  Stress: Not on file  Social Connections: Not on file   Family History  Problem Relation Age of Onset   Polycystic kidney disease Mother    Polycystic kidney disease Sister    Cancer Neg Hx    Diabetes Neg Hx    Heart disease Neg Hx    Allergies  Allergen Reactions   Codeine     Does not recall a reaction   Zestril [Lisinopril]     depression   Prior to Admission medications   Medication Sig Start Date End Date Taking? Authorizing Provider  acetaminophen (TYLENOL) 500 MG tablet Take 500-1,000 mg by mouth every 6 (six) hours as needed for moderate pain or mild pain.   Yes [provider]  calcium carbonate (OS-CAL - DOSED IN MG OF ELEMENTAL CALCIUM) 1250 (500 Ca) MG tablet Take 1 tablet by  mouth daily.   Yes [provider]  captopril (CAPOTEN) 25 MG tablet Take 25 mg by mouth 3 (three) times daily.   Yes [provider]  cholecalciferol (VITAMIN D) 1000 units tablet Take 1,000 Units by mouth daily.   Yes [provider]  Cyanocobalamin 3000 MCG LOZG Take 3,000 mcg by mouth daily.   Yes [provider]  hydrocortisone cream 1 % Apply 1 application topically daily as needed for itching.   Yes [provider]  pyridOXINE (VITAMIN B-6) 50 MG tablet Take 50 mg by mouth daily.   Yes [provider]  triamterene-hydrochlorothiazide (DYAZIDE) 37.5-25 MG capsule Take 1 capsule by mouth every other day.   Yes [provider]   DG Ankle Complete Right  Result Date: 12/27/2020 CLINICAL DATA:  Injury lateral ankle pain EXAM: RIGHT ANKLE - COMPLETE 3+ VIEW COMPARISON:  None. FINDINGS:  Acute minimally displaced fracture involving lateral fibular malleolus with overlying soft tissue swelling. Ankle mortise is symmetric. There is soft tissue swelling. Fixating screw in the fifth metatarsal IMPRESSION: Acute minimally displaced fibular malleolar fracture Electronically Signed   By: Jasmine Pang M.D.   On: 12/27/2020 15:11    Positive ROS: All other systems have been reviewed and were otherwise negative with the exception of those mentioned in the HPI and as above.  Physical Exam: General: Alert, no acute distress.    MUSCULOSKELETAL:  Right hip: Patient skin is intact.  There is no erythema ecchymosis swelling or obvious deformity.  She had tenderness over the anterior hip with minimal lateral tenderness.  She had moderate pain with internal and external rotation of the right hip in 90 degrees of flexion.  Patient can actively flex her hip and knee to approximately 70-80 degrees with mild pain.  Her thigh compartments are soft and compressible.  Right ankle: Patient's ASO brace was removed.  Patient has intact skin.  She has mild lateral swelling.  Has mild to moderate tenderness over the lateral malleolar area.  Patient had no tenderness medially.  Palpable pedal pulses, intact sensation light touch and can dorsiflex and plantarflex her ankle and flex and extend her toes.  Her leg compartments are soft and compressible.  There is no erythema or ecchymosis.  Assessment: Right ankle nondisplaced lateral malleolus fracture Right hip pain  Plan: I discontinued the patient's ASO brace for her right ankle.  I did not feel this was appropriate stabilization.  I placed the patient in an ASO brace with Ortho-Glass today molding the splint to stabilize her right ankle.  Patient should remain nonweightbearing on the right lower extremity.  I discussed this case with Janell Quiet, PA who works with Dr. Joice Lofts so they can plan accordingly for the patient's hip abductor repair scheduled for next  week.  Patient is complaining of right hip pain.  No x-rays above the ankle were performed of the right lower extremity.  Patient is having increasing pain in the right hip.  I am going to order x-ray films of the right hip and knee to evaluate for fracture.  She may weight-bear as tolerated on the left lower extremity and work with physical therapy as tolerated.  I will follow-up with the patient's x-rays once they have been completed.   Juanell Fairly, MD    12/28/2020 11:41 AM

## 2020-12-28 NOTE — Progress Notes (Signed)
Pt became nauseous and vomited clear fluid.patient expressed to have not eaten nor drinking fluid. Notified Attending MD. New order in place.

## 2020-12-28 NOTE — TOC Progression Note (Addendum)
Transition of Care [facility]) - Progression Note    Patient Details  Name: Tracy Herman MRN: [id number] Date of Birth: 1940/12/23  Transition of [facility]) CM/SW [place], RN Phone Number: 12/28/2020, 4:30 PM  Clinical Narrative:     Met with the patient in the room she stated that she is her husbands caregiver and she wants to go home with [facility] PT and OT, she needs a WC due to being non weight bearing, One will be delivered to the home by [employer], after DC, I contacted [facility] to set up with [facility] PT and OT, they are accepting, her husband is building a ramp at home for her, he does wood working she stated that she has transportation and can afford her medication       Expected Discharge Plan and Services                                                 Social Determinants of Health (SDOH) Interventions    Readmission Risk Interventions No flowsheet data found.

## 2020-12-28 NOTE — Progress Notes (Signed)
*  PRELIMINARY RESULTS* Echocardiogram 2D Echocardiogram has been performed.  Tracy Herman 12/28/2020, 11:09 AM

## 2020-12-28 NOTE — Progress Notes (Signed)
I have reviewed the patient's right hip, pelvis and knee xrays.   There is no evidence of hip, femur or knee fracture.   Patient may eat as no surgery is required.   Patient may resume PT/OT and will remain NWB on right ankle.  Patient may be discharged whenever cleared by medicine and may follow up with me or Dr. Joice Lofts in 7-10 days for re-evaluation of her right lateral malleolus fracture.  VITALS:   Vitals:   12/27/20 2328 12/28/20 0431 12/28/20 0802 12/28/20 1209  BP: (!) 148/57 (!) 128/59 119/67 (!) 144/71  Pulse: 65 (!) 54 61 67  Resp: 18 18 16 15   Temp: 98 F (36.7 C) 98 F (36.7 C) 99 F (37.2 C) 97.8 F (36.6 C)  TempSrc:  Oral Oral   SpO2: 98% 97% 98% 99%  Weight:      Height:        LABS  Results for orders placed or performed during the hospital encounter of 12/27/20 (from the past 24 hour(s))  Basic metabolic panel     Status: Abnormal   Collection Time: 12/27/20  2:14 PM  Result Value Ref Range   Sodium 132 (L) 135 - 145 mmol/L   Potassium 3.9 3.5 - 5.1 mmol/L   Chloride 98 98 - 111 mmol/L   CO2 26 22 - 32 mmol/L   Glucose, Bld 118 (H) 70 - 99 mg/dL   BUN 18 8 - 23 mg/dL   Creatinine, Ser 02/26/21 0.44 - 1.00 mg/dL   Calcium 9.5 8.9 - 4.48 mg/dL   GFR, Estimated 18.5 >63 mL/min   Anion gap 8 5 - 15  CBC     Status: Abnormal   Collection Time: 12/27/20  2:14 PM  Result Value Ref Range   WBC 9.1 4.0 - 10.5 K/uL   RBC 3.87 3.87 - 5.11 MIL/uL   Hemoglobin 12.6 12.0 - 15.0 g/dL   HCT 02/26/21 (L) 97.0 - 26.3 %   MCV 90.4 80.0 - 100.0 fL   MCH 32.6 26.0 - 34.0 pg   MCHC 36.0 30.0 - 36.0 g/dL   RDW 78.5 88.5 - 02.7 %   Platelets 213 150 - 400 K/uL   nRBC 0.0 0.0 - 0.2 %  Urinalysis, Complete w Microscopic     Status: Abnormal   Collection Time: 12/27/20  2:14 PM  Result Value Ref Range   Color, Urine YELLOW (A) YELLOW   APPearance CLEAR (A) CLEAR   Specific Gravity, Urine 1.014 1.005 - 1.030   pH 6.0 5.0 - 8.0   Glucose, UA NEGATIVE NEGATIVE mg/dL   Hgb urine  dipstick NEGATIVE NEGATIVE   Bilirubin Urine NEGATIVE NEGATIVE   Ketones, ur 5 (A) NEGATIVE mg/dL   Protein, ur NEGATIVE NEGATIVE mg/dL   Nitrite NEGATIVE NEGATIVE   Leukocytes,Ua TRACE (A) NEGATIVE   RBC / HPF 0-5 0 - 5 RBC/hpf   WBC, UA 0-5 0 - 5 WBC/hpf   Bacteria, UA NONE SEEN NONE SEEN   Squamous Epithelial / LPF 0-5 0 - 5   Mucus PRESENT   Resp Panel by RT-PCR (Flu A&B, Covid) Nasopharyngeal Swab     Status: None   Collection Time: 12/27/20  6:51 PM   Specimen: Nasopharyngeal Swab; Nasopharyngeal(NP) swabs in vial transport medium  Result Value Ref Range   SARS Coronavirus 2 by RT PCR NEGATIVE NEGATIVE   Influenza A by PCR NEGATIVE NEGATIVE   Influenza B by PCR NEGATIVE NEGATIVE  TSH     Status: None  Collection Time: 12/28/20  4:21 AM  Result Value Ref Range   TSH 1.855 0.350 - 4.500 uIU/mL    DG Knee 1-2 Views Right  Result Date: 12/28/2020 CLINICAL DATA:  Right knee pain EXAM: RIGHT KNEE - 1-2 VIEW COMPARISON:  None. FINDINGS: No joint effusion identified. Mild medial compartment degenerative change and chondrocalcinosis noted. No fractures or dislocation. IMPRESSION: 1. No acute findings. 2. Mild degenerative change and chondrocalcinosis. Electronically Signed   By: Signa Kell M.D.   On: 12/28/2020 13:08   DG Ankle Complete Right  Result Date: 12/27/2020 CLINICAL DATA:  Injury lateral ankle pain EXAM: RIGHT ANKLE - COMPLETE 3+ VIEW COMPARISON:  None. FINDINGS: Acute minimally displaced fracture involving lateral fibular malleolus with overlying soft tissue swelling. Ankle mortise is symmetric. There is soft tissue swelling. Fixating screw in the fifth metatarsal IMPRESSION: Acute minimally displaced fibular malleolar fracture Electronically Signed   By: Jasmine Pang M.D.   On: 12/27/2020 15:11   DG HIP UNILAT WITH PELVIS 2-3 VIEWS RIGHT  Result Date: 12/28/2020 CLINICAL DATA:  Right knee pain in hip pain EXAM: DG HIP (WITH OR WITHOUT PELVIS) 2-3V RIGHT COMPARISON:   None FINDINGS: Both hips appear located and intact. No signs of acute fracture or dislocation. 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 IMPRESSION: Mild right hip osteoarthritis. Electronically Signed   By: Signa Kell M.D.   On: 12/28/2020 13:09    Assessment/Plan:     Principal Problem:   Syncope Active Problems:   Benign essential hypertension   Right fibular fracture    Tracy Herman , MD 12/28/2020, 2:00 PM

## 2020-12-28 NOTE — Progress Notes (Signed)
OT Cancellation Note  Patient Details Name: Tracy Herman MRN: [id number] DOB: [birth year]   Cancelled Treatment:    Reason Eval/Treat Not Completed: Patient at procedure or test/ unavailable. Consult received, chart reviewed. Upon initial attempt, pt getting echo. 2nd attempt pt out of room for imaging. Chart noting Pt getting imaging of R hip after complaints of R hip pain. Will re-attempt OT evaluation at later date/time as pt is available and appropriate.   Wynona Canes, MPH, MS, OTR/L ascom [phone] 12/28/20, 1:00 PM

## 2020-12-28 NOTE — Evaluation (Signed)
Occupational Therapy Evaluation Patient Details Name: Tracy Herman MRN: [id number] DOB: 1940/09/12 Today's Date: 12/28/2020    History of Present Illness Pt is a 80 y.o. female presenting to hospital 8/10 with syncope (was dizzy, blacked out, and then fell face first onto ground); husband assisted pt into chair.  Pt reporting R ankle pain.  Pt admitted with syncope with loss of consciousnes and R displaced fibular malleolar fx s/p fall.  Of note, pt with planned surgery with Dr. 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  PMH includes htn, HLD, polycystic kidney disease, chronic L gluteus medius tendon tear, breast sx, chronic diarrhea.   Clinical Impression   Pt was seen for OT evaluation this date. 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 Pt lives with her spouse. Currently pt demonstrates impairments in strength, ROM, balance, and RLE pain as described below (See OT problem list) which functionally limit her ability to perform ADL/self-care tasks. Pt currently requires MIN A for LB ADL and supervision for bed mobility. Pt educated in AE/DME for bathing and home/routines modifications for ADL to support NWBing RLE. Pt would benefit from skilled OT services to address noted impairments and functional limitations (see below for any additional details) in order to maximize safety and independence while minimizing falls risk and caregiver burden. Upon hospital discharge, recommend HHOT to maximize pt safety and return to functional independence during meaningful occupations of daily life.    Follow Up Recommendations  Home health OT    Equipment Recommendations  3 in 1 bedside commode;Tub/shower bench    Recommendations for Other Services       Precautions / Restrictions Precautions Precautions: Fall Precaution Comments: R ankle splint Restrictions Weight Bearing Restrictions: Yes RLE Weight Bearing: Non weight bearing Other  Position/Activity Restrictions: Per MD Martha Clan 8/11: NWB until a cast can be placed      Mobility Bed Mobility Overal bed mobility: Needs Assistance Bed Mobility: Supine to Sit;Sit to Supine     Supine to sit: Supervision;HOB elevated Sit to supine: Supervision;HOB elevated        Transfers                      Balance                                           ADL either performed or assessed with clinical judgement   ADL Overall ADL's : Needs assistance/impaired                                       General ADL Comments: Pt currently requires MIN A for LB ADL, pt reports spouse able to assist as needed     Vision Baseline Vision/History: Wears glasses Wears Glasses: Reading only Patient Visual Report: No change from baseline       Perception     Praxis      Pertinent Vitals/Pain Pain Assessment: 0-10 Pain Score: 3  Pain Location: R ankle; R anterior proximal thigh (tender to palpation and with R LE movement) Pain Descriptors / Indicators: Sore;Tender Pain Intervention(s): Limited activity within patient's tolerance;Monitored during session;Premedicated before session;Repositioned     Hand Dominance     Extremity/Trunk Assessment Upper Extremity Assessment Upper Extremity Assessment:  Generalized weakness   Lower Extremity Assessment Lower Extremity Assessment: RLE deficits/detail;LLE deficits/detail RLE Deficits / Details: able to wiggle toes; at least 3/5 knee flexion/extension AROM; at least 3-/5 R hip flexion (limited d/t proximal anterior thigh pain); R ankle in brace type splint RLE: Unable to fully assess due to pain;Unable to fully assess due to immobilization LLE Deficits / Details: at least 3/5 AROM hip flexion, knee flexion/extension, and DF/PF   Cervical / Trunk Assessment Cervical / Trunk Assessment: Normal   Communication Communication Communication: No difficulties   Cognition  Arousal/Alertness: Awake/alert Behavior During Therapy: WFL for tasks assessed/performed Overall Cognitive Status: Within Functional Limits for tasks assessed                                     General Comments       Exercises Other Exercises Other Exercises: Pt educated in AE/DME for bathing and home/routines modifications for ADL to support NWBing RLE   Shoulder Instructions      Home Living Family/patient expects to be discharged to:: Private residence Living Arrangements: Spouse/significant other Available Help at Discharge: Family;Available 24 hours/day Type of Home: House Home Access: Stairs to enter [employer] of Steps: 3 STE no railing (pt's husband was planning on building pt a ramp for next weeks surgery)   Home Layout: One level     Bathroom Shower/Tub: Chief Strategy Officer: Standard     Home Equipment: Environmental consultant - 2 wheels;Bedside commode   Additional Comments: Pt is a retired Runner, 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      Prior Functioning/Environment Level of Independence: Independent with assistive device(s)        Comments: Pt ambulating with SPC prior to hospitalization; no other recent falls.  Pt reports pt's husband has dementia but is physically strong and can assist her.        OT Problem List: Decreased strength;Decreased range of motion;Pain;Impaired balance (sitting and/or standing);Decreased knowledge of use of DME or AE      OT Treatment/Interventions: Self-care/ADL training;Therapeutic exercise;Therapeutic activities;DME and/or AE instruction;Patient/family education;Balance training    OT Goals(Current goals can be found in the care plan section) Acute Rehab OT Goals Patient Stated Goal: to go home OT Goal Formulation: With patient Time For Goal Achievement: 01/11/21 Potential to Achieve Goals: Good ADL Goals Pt Will Perform Lower Body Dressing: with modified  independence;sitting/lateral leans Pt Will Transfer to Toilet: with supervision;bedside commode;stand pivot transfer (LRAD PRN, RLE NWBing) Additional ADL Goal #1: Pt will perform sponge bath from seated position with set up and supervision  OT Frequency: Min 2X/week   Barriers to D/C:            Co-evaluation              AM-PAC OT "6 Clicks" Daily Activity     Outcome Measure Help from another person eating meals?: None Help from another person taking care of personal grooming?: None Help from another person toileting, which includes using toliet, bedpan, or urinal?: A Little Help from another person bathing (including washing, rinsing, drying)?: A Little Help from another person to put on and taking off regular upper body clothing?: None Help from another person to put on and taking off regular lower body clothing?: A Little 6 Click Score: 21   End of Session    Activity Tolerance: Patient tolerated treatment well Patient left:  in bed;with call bell/phone within reach;with bed alarm set  OT Visit Diagnosis: Other abnormalities of gait and mobility (R26.89);Pain;Muscle weakness (generalized) (M62.81) Pain - Right/Left: Right Pain - part of body: Leg;Ankle and joints of foot                Time: 0263-7858 OT Time Calculation (min): 17 min Charges:  OT General Charges $OT Visit: 1 Visit OT Evaluation $OT Eval Low Complexity: 1 Low OT Treatments $Self Care/Home Management : 8-22 mins  Wynona Canes, MPH, MS, OTR/L ascom [phone] 12/28/20, 4:57 PM

## 2020-12-28 NOTE — Evaluation (Signed)
Physical Therapy Evaluation Patient Details Name: Tracy Herman MRN: [id number] DOB: August 23, 1940 Today's Date: 12/28/2020   History of Present Illness  Pt is a 80 y.o. female presenting to hospital 8/10 with syncope (was dizzy, blacked out, and then fell face first onto ground); husband assisted pt into chair.  Pt reporting R ankle pain.  Pt admitted with syncope with loss of consciousnes and R displaced fibular malleolar fx s/p fall.  Of note, pt with planned surgery with Dr. 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  PMH includes htn, HLD, polycystic kidney disease, chronic L gluteus medius tendon tear, breast sx, chronic diarrhea.  Clinical Impression  Prior to hospital admission, pt was ambulatory with [facility]; lives with her husband (who has dementia) in 1 level home (pt was going to call her husband today to start building her a ramp into home--pt was planning on surgery for chronic L gluteus medius tendon tear next week and husband was planning on building her a ramp prior to that surgery).  Currently pt is SBA with bed mobility and CGA to min assist with transfers.  Pt performed stand pivot transfer bed to recliner but c/o proximal R anterior thigh pain complicating pt's ability to keep R LE NWB'ing so performed stand pivot with RW recliner to bed (pt's R LE kept NWB'ing throughout PT session).  Deferred ambulation d/t concerns for pt's chronic L gluteus medius tendon tear (will plan for w/c level functional mobility goals).  Pt noted with L ankle brace type splint on--therapist discussed with pt's nurse regarding whether pt should have hard posterior splint instead (nurse reported she would discuss with MD).  Nurse also notified regarding pt's anterior proximal R thigh pain during session.  Pt would benefit from skilled PT to address noted impairments and functional limitations (see below for any additional details).  Upon hospital discharge, pt would benefit from HHPT and 24/7 assistance.     Follow Up Recommendations Home health PT;Supervision/Assistance - 24 hour    Equipment Recommendations  Rolling walker with 5" wheels;3in1 (PT);Wheelchair (measurements PT);Wheelchair cushion (measurements PT) (pt has RW and BSC at home already)    Recommendations for Other Services OT consult     Precautions / Restrictions Precautions Precautions: Fall Precaution Comments: R ankle splint Restrictions Weight Bearing Restrictions: Yes RLE Weight Bearing: Non weight bearing Other Position/Activity Restrictions: Per MD Martha Clan 8/11: NWB until a cast can be placed      Mobility  Bed Mobility Overal bed mobility: Needs Assistance Bed Mobility: Supine to Sit;Sit to Supine     Supine to sit: Supervision;HOB elevated Sit to supine: Supervision;HOB elevated   General bed mobility comments: SBA for safety    Transfers Overall transfer level: Needs assistance Equipment used: None;Rolling walker (2 wheeled) Transfers: Sit to/from [employer] Sit to Stand: Min guard;Min assist (x1 trial standing from recliner; vc's for UE/LE positioning and overall technique) Stand pivot transfers: Min assist       General transfer comment: squat pivot bed to recliner (towards L side) and then stand pivot with RW recliner to bed (towards R side); initial vc's for technique and positioning  Ambulation/Gait             General Gait Details: Deferred d/t safety concerns (pt with chronic L gluteus medius tendon tear and plan for surgery; pt also NWB'ing R LE)  Stairs            Wheelchair Mobility    Modified Rankin (Stroke Patients Only)  Balance Overall balance assessment: Needs assistance Sitting-balance support: No upper extremity supported;Feet unsupported Sitting balance-Leahy Scale: Good Sitting balance - Comments: steady sitting reaching within BOS   Standing balance support: Bilateral upper extremity supported Standing balance-Leahy Scale:  Fair Standing balance comment: steady static standing with B UE support on RW                             Pertinent Vitals/Pain Pain Assessment: 0-10 Pain Score: 6  Pain Location: R ankle; R anterior proximal thigh (tender to palpation and with R LE movement) Pain Descriptors / Indicators: Sore;Tender Pain Intervention(s): Limited activity within patient's tolerance;Monitored during session;Premedicated before session;Repositioned Vitals (HR and O2 on room air) stable and WFL throughout treatment session.    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    Prior Function Level of Independence: Independent with assistive device(s)         Comments: Pt ambulating with SPC prior to hospitalization; no other recent falls.  Pt reports pt's husband has dementia but is physically strong and can assist her.     Hand Dominance        Extremity/Trunk Assessment   Upper Extremity Assessment Upper Extremity Assessment: Generalized weakness    Lower Extremity Assessment Lower Extremity Assessment: RLE deficits/detail;LLE deficits/detail RLE Deficits / Details: able to wiggle toes; at least 3/5 knee flexion/extension AROM; at least 3-/5 R hip flexion (limited d/t proximal anterior thigh pain); R ankle in brace type splint RLE: Unable to fully assess due to pain;Unable to fully assess due to immobilization LLE Deficits / Details: at least 3/5 AROM hip flexion, knee flexion/extension, and  DF/PF    Cervical / Trunk Assessment Cervical / Trunk Assessment: Normal  Communication   Communication: No difficulties  Cognition Arousal/Alertness: Awake/alert Behavior During Therapy: WFL for tasks assessed/performed Overall Cognitive Status: Within Functional Limits for tasks assessed                                        General Comments General comments (skin integrity, edema, etc.): R ankle in brace type splint.  Nursing cleared pt for participation in physical therapy.  Pt agreeable to PT session.    Exercises  Transfer training   Assessment/Plan    PT Assessment Patient needs continued PT services  PT Problem List Decreased strength;Decreased range of motion;Decreased activity tolerance;Decreased balance;Decreased mobility;Decreased knowledge of use of DME;Decreased knowledge of precautions;Pain       PT Treatment Interventions DME instruction;Gait training;Functional mobility training;Therapeutic activities;Therapeutic exercise;Balance training;Patient/family education    PT Goals (Current goals can be found in the Care Plan section)  Acute Rehab PT Goals Patient Stated Goal: to go home PT Goal Formulation: With patient Time For Goal Achievement: 01/11/21 Potential to Achieve Goals: Good    Frequency 7X/week   Barriers to discharge        Co-evaluation  AM-PAC PT "6 Clicks" Mobility  Outcome Measure Help needed turning from your back to your side while in a flat bed without using bedrails?: None Help needed moving from lying on your back to sitting on the side of a flat bed without using bedrails?: A Little Help needed moving to and from a bed to a chair (including a wheelchair)?: A Little Help needed standing up from a chair using your arms (e.g., wheelchair or bedside chair)?: A Little Help needed to walk in hospital room?: Total Help needed climbing 3-5 steps with a railing? : Total 6 Click Score: 15    End of  Session Equipment Utilized During Treatment: Gait belt Activity Tolerance: Patient limited by pain Patient left: in bed;with call bell/phone within reach;with bed alarm set;Other (comment) (B heels floating via pillow support) Nurse Communication: Mobility status;Precautions;Weight bearing status;Other (comment) (pt's pain status) PT Visit Diagnosis: Other abnormalities of gait and mobility (R26.89);Muscle weakness (generalized) (M62.81);History of falling (Z91.81);Pain Pain - Right/Left: Right Pain - part of body: Ankle and joints of foot    Time: 8366-2947 PT Time Calculation (min) (ACUTE ONLY): 42 min   Charges:   PT Evaluation $PT Eval Low Complexity: 1 Low PT Treatments $Therapeutic Activity: 23-37 mins       Hendricks Limes, PT 12/28/20, 11:41 AM

## 2020-12-29 DIAGNOSIS — R55 Syncope and collapse: Secondary | ICD-10-CM | POA: Diagnosis not present

## 2020-12-29 LAB — GLUCOSE, CAPILLARY: Glucose-Capillary: 87 mg/dL (ref 70–99)

## 2020-12-29 MED ORDER — HYDROCODONE-ACETAMINOPHEN 5-325 MG PO TABS: 1.0000 | ORAL_TABLET | ORAL | 0 refills | Status: DC | PRN

## 2020-12-29 MED ORDER — SENNA 8.6 MG PO TABS: 1.0000 | ORAL_TABLET | Freq: Two times a day (BID) | ORAL | 0 refills | Status: DC | PRN

## 2020-12-29 MED ORDER — CAPTOPRIL 25 MG PO TABS: 25.0000 mg | ORAL_TABLET | Freq: Two times a day (BID) | ORAL | 0 refills | Status: AC

## 2020-12-29 NOTE — Progress Notes (Signed)
I reviewed the x-ray findings with the patient.  There is no evidence of hip, femur or knee fracture on her plain films yesterday.  Patient may continue with hip movement as tolerated.  She will remain nonweightbearing on the right lower extremity however due to her lateral malleolus fracture.  She will continue her AO splint until she follows up in my office in 7 to 10 days for reevaluation and x-ray.  The cast will be applied at that time.  Patient will follow up with Dr. Joice Lofts regarding rescheduling her left hip surgery which was scheduled for next week.

## 2020-12-29 NOTE — Progress Notes (Signed)
Physical Therapy Treatment Patient Details Name: Tracy Herman MRN: [id number] DOB: 1940-12-20 Today's Date: 12/29/2020    History of Present Illness Pt is a 80 y.o. female presenting to hospital 8/10 with syncope (was dizzy, blacked out, and then fell face first onto ground); husband assisted pt into chair.  Pt reporting R ankle pain.  Pt admitted with syncope with loss of consciousnes and R displaced fibular malleolar fx s/p fall.  Of note, pt with planned surgery with Dr. 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  PMH includes htn, HLD, polycystic kidney disease, chronic L gluteus medius tendon tear, breast sx, chronic diarrhea.    PT Comments    Pt was long sitting in bed u[pon arriving. She endorses being excited to DC home this afternoon. Pt/author had lengthy discussion about how she will get into/out of house. Spouse is planning to build ramp however will not be installed by the end of the day. PT recommend EMS transport home if Dc'd today. She was able to exit bed and stand pivot to w/c. Most of session spent educating pt on w/c and how to maneuver it. Overall did well with WC but will need reenforcement going forward. Pt's wt bearing restrictions in addition to LLE deficits makes "hopping/ambulation" not safe. Recommend stand pivot transfers until wt bearing status changes. Pt will need a w/c, RW, and EMS transport if DC home today.   Follow Up Recommendations  Home health PT;Supervision/Assistance - 24 hour;Other (comment) (spouse building ramp however would benefit from EMS transport home)     Equipment Recommendations  Rolling walker with 5" wheels;3in1 (PT);Wheelchair (measurements PT);Wheelchair cushion (measurements PT)    Recommendations for Other Services       Precautions / Restrictions Precautions Precautions: Fall Precaution Comments: R ankle splint Splint/Cast - Date Prophylactic Dressing Applied (if applicable): 12/28/20 Restrictions Weight Bearing  Restrictions: Yes RLE Weight Bearing: Non weight bearing    Mobility  Bed Mobility Overal bed mobility: Needs Assistance Bed Mobility: Supine to Sit     Supine to sit: Supervision;HOB elevated Sit to supine: Supervision;HOB elevated   General bed mobility comments: VCs for RLE NWBing in bed    Transfers Overall transfer level: Needs assistance Equipment used: Rolling walker (2 wheeled) Transfers: Sit to/from Stand Sit to Stand: Min guard;From elevated surface;Min assist Stand pivot transfers: Min assist       General transfer comment: CGA from elevated however min assist from lower surface heights. Min assist form lower w/c surface  Ambulation/Gait    General Gait Details: limited distance 2/2 to wt bearing status and pt needing LLE surgery in near future   Corporate treasurer Wheelchair mobility: Yes Wheelchair propulsion: Both upper extremities Wheelchair parts: Needs assistance Distance: 200 Wheelchair Assistance Details (indicate cue type and reason): pt was educated on proper use of manual wc and how to lock/unlock brakes, use elevating leg rest, swing away leg rest, etc. pt states understanding hopwever will need reenforcement in future sessions.    Balance Overall balance assessment: Needs assistance Sitting-balance support: No upper extremity supported;Feet unsupported Sitting balance-Leahy Scale: Good     Standing balance support: During functional activity;Bilateral upper extremity supported (BUE) Standing balance-Leahy Scale: Fair Standing balance comment: was able to stand pivot to w/c and recliner with BUE support on RW      Cognition Arousal/Alertness: Awake/alert Behavior During Therapy: [facility] for tasks assessed/performed Overall Cognitive Status: No family/caregiver present to determine baseline cognitive functioning      General Comments:  Pt is A and oriented however does have some cognition deficits come to light during  session. Required constant reenforcement throughotu session for task previously taught      Exercises Other Exercises Other Exercises: Pt educated re: DME recs, d/c recs, home/routines modifications, functional application of NWBing pcns Other Exercises: LBD, toileting, sup<>sit, sit<>stand, sitting/standing balance/tolerance, SPT        Pertinent Vitals/Pain Pain Assessment: 0-10 Pain Score: 2  Faces Pain Scale: Hurts a little bit Pain Location: R ankle Pain Descriptors / Indicators: Sore;Tender Pain Intervention(s): Limited activity within patient's tolerance;Monitored during session;Premedicated before session;Repositioned     PT Goals (current goals can now be found in the care plan section) Acute Rehab PT Goals Patient Stated Goal: to go home Progress towards PT goals: Progressing toward goals    Frequency    7X/week      PT Plan Current plan remains appropriate       AM-PAC PT "6 Clicks" Mobility   Outcome Measure  Help needed turning from your back to your side while in a flat bed without using bedrails?: None Help needed moving from lying on your back to sitting on the side of a flat bed without using bedrails?: A Little Help needed moving to and from a bed to a chair (including a wheelchair)?: A Little Help needed standing up from a chair using your arms (e.g., wheelchair or bedside chair)?: A Little Help needed to walk in hospital room?: Total Help needed climbing 3-5 steps with a railing? : Total 6 Click Score: 15    End of Session Equipment Utilized During Treatment: Gait belt Activity Tolerance: Patient tolerated treatment well Patient left: with call bell/phone within reach;Other (comment);in chair;with chair alarm set Nurse Communication: Mobility status PT Visit Diagnosis: Other abnormalities of gait and mobility (R26.89);Muscle weakness (generalized) (M62.81);History of falling (Z91.81);Pain Pain - Right/Left: Right Pain - part of body: Ankle and  joints of foot     Time: 2774-1287 PT Time Calculation (min) (ACUTE ONLY): 28 min  Charges:  $Therapeutic Activity: 8-22 mins $Wheel Chair Management: 8-22 mins                     Jetta Lout PTA 12/29/20, 1:17 PM

## 2020-12-29 NOTE — TOC Progression Note (Signed)
Transition of Care [facility]) - Progression Note    Patient Details  Name: Tracy Herman MRN: [id number] Date of Birth: 1940/08/05  [facility] of [facility]) CM/SW Contact  Caryn Section, RN Phone Number: 12/29/2020, 4:13 PM  Clinical Narrative:   Wheelchair being delivered to patient's room by [employer], daughter will take wheelchair home with her  EMS will be contacted when patient receives wheelchair for transport home today.         Expected Discharge Plan and Services           Expected Discharge Date: 12/29/20                                     Social Determinants of Health (SDOH) Interventions    Readmission Risk Interventions No flowsheet data found.

## 2020-12-29 NOTE — Progress Notes (Addendum)
Occupational Therapy Treatment Patient Details Name: Tracy Herman MRN: [id number] DOB: [birth year] Today's Date: 12/29/2020    History of present illness Pt is a 80 y.o. female presenting to hospital 8/10 with syncope (was dizzy, blacked out, and then fell face first onto ground); husband assisted pt into chair.  Pt reporting R ankle pain.  Pt admitted with syncope with loss of consciousnes and R displaced fibular malleolar fx s/p fall.  Of note, pt with planned surgery with Dr. 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  PMH includes htn, HLD, polycystic kidney disease, chronic L gluteus medius tendon tear, breast sx, chronic diarrhea.   OT comments  Tracy Herman was seen for OT treatment on this date. Upon arrival to room pt reclined in bed, agreeable to tx. Pt instructed in DME recs, d/c recs, home/routines modifications, functional application of [employer] pcns. Pt requires MIN A + RW BSC t/f - minor LOB with turn, able to self correct. SETUP for perihygiene c lateral leans, MOD A for brief mgmt in standing. MIN A for LB access - pt reports plan for husband to assist. Pt making good progress toward goals. Pt continues to benefit from skilled OT services to maximize return to PLOF and minimize risk of future falls, injury, caregiver burden, and readmission. Will continue to follow POC. Discharge recommendation remains appropriate.    Follow Up Recommendations  Home health OT;Supervision/Assistance - 24 hour    Equipment Recommendations  3 in 1 bedside commode;Tub/shower bench;Other (comment) (drop arm BSC)    Recommendations for Other Services      Precautions / Restrictions Precautions Precautions: Fall Precaution Comments: R ankle splint Restrictions Weight Bearing Restrictions: Yes RLE Weight Bearing: Non weight bearing       Mobility Bed Mobility Overal bed mobility: Needs Assistance Bed Mobility: Supine to Sit;Sit to Supine     Supine to sit: Supervision;HOB elevated Sit  to supine: Supervision;HOB elevated   General bed mobility comments: VCs for RLE [employer] in bed    Transfers Overall transfer level: Needs assistance Equipment used: Rolling walker (2 wheeled) Transfers: Sit to/from [employer] Sit to Stand: Min guard Stand pivot transfers: Min assist            Balance Overall balance assessment: Needs assistance Sitting-balance support: No upper extremity supported;Feet unsupported Sitting balance-Leahy Scale: Good     Standing balance support: Single extremity supported;During functional activity Standing balance-Leahy Scale: Fair Standing balance comment: single LOB in standing with turns, able to self correct                           ADL either performed or assessed with clinical judgement   ADL Overall ADL's : Needs assistance/impaired                                       General ADL Comments: MIN A + RW toileting at [facility], SETUP for perihygiene c lateral leans. MOD A for brief mgmt in standing. MIN A for LB access - pt reports plan for husband to assist.      Cognition Arousal/Alertness: Awake/alert Behavior During Therapy: WFL for tasks assessed/performed Overall Cognitive Status: Within Functional Limits for tasks assessed  General Comments: pleasant and A&O x4        Exercises Exercises: Other exercises Other Exercises Other Exercises: Pt educated re: DME recs, d/c recs, home/routines modifications, functional application of [employer] pcns Other Exercises: LBD, toileting, sup<>sit, sit<>stand, sitting/standing balance/tolerance, SPT           Pertinent Vitals/ Pain       Pain Assessment: Faces Faces Pain Scale: Hurts a little bit Pain Location: R ankle Pain Descriptors / Indicators: Sore;Tender Pain Intervention(s): Limited activity within patient's tolerance;Repositioned   Frequency  Min 2X/week        Progress Toward  Goals  OT Goals(current goals can now be found in the care plan section)  Progress towards OT goals: Progressing toward goals  Acute Rehab OT Goals Patient Stated Goal: to go home OT Goal Formulation: With patient Time For Goal Achievement: 01/11/21 Potential to Achieve Goals: Good ADL Goals Pt Will Perform Lower Body Dressing: with modified independence;sitting/lateral leans Pt Will Transfer to Toilet: with supervision;bedside commode;stand pivot transfer Additional ADL Goal #1: Pt will perform sponge bath from seated position with set up and supervision  Plan Discharge plan remains appropriate;Frequency remains appropriate    Co-evaluation                 AM-PAC OT "6 Clicks" Daily Activity     Outcome Measure   Help from another person eating meals?: None Help from another person taking care of personal grooming?: None Help from another person toileting, which includes using toliet, bedpan, or urinal?: A Little Help from another person bathing (including washing, rinsing, drying)?: A Little Help from another person to put on and taking off regular upper body clothing?: None Help from another person to put on and taking off regular lower body clothing?: A Little 6 Click Score: 21    End of Session Equipment Utilized During Treatment: Rolling walker  OT Visit Diagnosis: Other abnormalities of gait and mobility (R26.89);Muscle weakness (generalized) (M62.81)   Activity Tolerance Patient tolerated treatment well   Patient Left in bed;with bed alarm set;with call bell/phone within reach   Nurse Communication          Time: 3151-7616 OT Time Calculation (min): 26 min  Charges: OT General Charges $OT Visit: 1 Visit OT Treatments $Self Care/Home Management : 23-37 mins  Kathie Dike, M.S. OTR/L  12/29/20, 11:53 AM  ascom [phone]

## 2020-12-29 NOTE — Discharge Summary (Signed)
Physician Discharge Summary  Burman FosterMyra C Bolz ZOX:[id number] DOB: 02/07/1941 DOA: 12/27/2020  PCP: Kandyce RudBabaoff, Marcus, MD  Admit date: 12/27/2020 Discharge date: 12/29/2020  Admitted From: Home Disposition: Home with home health  Recommendations for Outpatient Follow-up:  Follow up with PCP in 1-2 weeks Follow-up as directed with orthopedic surgery  Home Health: Yes Equipment/Devices: Right leg splint; wheelchair  Discharge Condition: Stable CODE STATUS: Full Diet recommendation: Regular  Brief/Interim Summary: [age] y.o. female with medical history significant for HTN, HLD, polycystic kidney disease, chronic left gluteus medius tendon tear who presents with syncope and loss of consciousness.   She reports that today she was fixing breakfast and was walking across the room to her chair.  Then everything blacked out and she fell face first onto the ground.  She felt dizzy right before the episode.  Her husband picked her up and sat her in her chair and she came back to consciousness.  Had right ankle pain.  Denies any chest pain or palpitation.  No shortness of breath.  States something similar happened about several years ago but was never worked up.  She denies any new medications or any changes to her current medication.   She states that she has small bowel bacterial overgrowth and has chronic diarrhea.  Last episode was 2 days ago but reports being able to stay hydrated.   Normally ambulate without assistance.  Medical work-up including echocardiogram within normal limits and orthostatic vitals, negative.  Patient was evaluated by therapy and deemed appropriate for discharge home with home health services.  Home health DME 3 and 1 and wheelchair were provided.  Patient discharged home in stable condition.  Patient was seen in consultation by orthopedic surgery.  Splint was placed to lower right leg.  Dr. Martha ClanKrasinski will speak with Dr. Binnie RailPoggi's service to coordinate timing of left gluteal muscle  repair.                  Discharge Diagnoses:  Principal Problem:   Syncope Active Problems:   Benign essential hypertension   Right fibular fracture Syncope Unclear etiology Suspect vasovagal Patient had a prodrome of dizziness prior to syncopal event Echocardiogram reassuring Normal sinus rhythm on telemetry Orthostatics negative Therapy evaluations recommending home with home health Home DME 3 and 1 and wheelchair ordered   Fibular malleolus fracture right Chronic left gluteus medius tendon tear EDP consulted Dr. Martha ClanKrasinski Nonsurgical management Splint applied at bedside Patient had pain in right hip Plan: Patient had planned orthopedic surgery with Dr. Joice LoftsPoggi on 8/16.  Dr. Martha ClanKrasinski spoke with Dr. Binnie RailPoggi's service and will coordinate timing of left gluteal muscle repair.  Discharged home with follow-up with orthopedics in 7 to 10 days.  Essential hypertension PTA captopril PTA triamterene-hydrochlorothiazide   Discharge Instructions  Discharge Instructions     Diet - low sodium heart healthy   Complete by: As directed    Increase activity slowly   Complete by: As directed       Allergies as of 12/29/2020       Reactions   Codeine    Does not recall a reaction   Zestril [lisinopril]    depression        Medication List     TAKE these medications    acetaminophen 500 MG tablet Commonly known as: TYLENOL Take 500-1,000 mg by mouth every 6 (six) hours as needed for moderate pain or mild pain.   calcium carbonate 1250 (500 Ca) MG tablet Commonly known as: OS-CAL - dosed  in mg of elemental calcium Take 1 tablet by mouth daily.   captopril 25 MG tablet Commonly known as: CAPOTEN Take 1 tablet (25 mg total) by mouth 2 (two) times daily. What changed: when to take this   cholecalciferol 1000 units tablet Commonly known as: VITAMIN D Take 1,000 Units by mouth daily.   Cyanocobalamin 3000 MCG Lozg Take 3,000 mcg by mouth daily.    HYDROcodone-acetaminophen 5-325 MG tablet Commonly known as: NORCO/VICODIN Take 1 tablet by mouth every 4 (four) hours as needed for severe pain or moderate pain.   hydrocortisone cream 1 % Apply 1 application topically daily as needed for itching.   pyridOXINE 50 MG tablet Commonly known as: VITAMIN B-6 Take 50 mg by mouth daily.   senna 8.6 MG Tabs tablet Commonly known as: SENOKOT Take 1 tablet (8.6 mg total) by mouth 2 (two) times daily as needed for mild constipation.   triamterene-hydrochlorothiazide 37.5-25 MG capsule Commonly known as: DYAZIDE Take 1 capsule by mouth every other day.               Durable Medical Equipment  (From admission, onward)           Start     Ordered   12/29/20 0913  For home use only DME 3 n 1  Once        12/29/20 0912   12/29/20 0913  For home use only DME Shower stool  Once        12/29/20 0912   12/28/20 1623  For home use only DME lightweight manual wheelchair with seat cushion  Once       Comments: Patient suffers from R Fibular FX, R Lateral Malleolus, L Gluteus Medius Tendon Tear which impairs their ability to perform daily activities like Adls  in the home.  A walking aide will not resolve issue with performing activities of daily living. A wheelchair will allow patient to safely perform daily activities. Patient is not able to propel themselves in the home using a standard weight wheelchair due to weakness Patient can self propel in the lightweight wheelchair. Length of need 6 months, Accessories:  Right elevating leg rests (ELRs), wheel locks, extensions and anti-tippers. Seat cushion   12/28/20 1628            Allergies  Allergen Reactions   Codeine     Does not recall a reaction   Zestril [Lisinopril]     depression    Consultations: Orthopedics   Procedures/Studies: DG Knee 1-2 Views Right  Result Date: 12/28/2020 CLINICAL DATA:  Right knee pain EXAM: RIGHT KNEE - 1-2 VIEW COMPARISON:  None. FINDINGS:  No joint effusion identified. Mild medial compartment degenerative change and chondrocalcinosis noted. No fractures or dislocation. IMPRESSION: 1. No acute findings. 2. Mild degenerative change and chondrocalcinosis. Electronically Signed   By: Signa Kell M.D.   On: 12/28/2020 13:08   DG Ankle Complete Right  Result Date: 12/27/2020 CLINICAL DATA:  Injury lateral ankle pain EXAM: RIGHT ANKLE - COMPLETE 3+ VIEW COMPARISON:  None. FINDINGS: Acute minimally displaced fracture involving lateral fibular malleolus with overlying soft tissue swelling. Ankle mortise is symmetric. There is soft tissue swelling. Fixating screw in the fifth metatarsal IMPRESSION: Acute minimally displaced fibular malleolar fracture Electronically Signed   By: Jasmine Pang M.D.   On: 12/27/2020 15:11   ECHOCARDIOGRAM COMPLETE  Result Date: 12/28/2020    ECHOCARDIOGRAM REPORT   Patient Name:   CLARISE CHACKO Date of Exam: 12/28/2020 Medical Rec #:  [id number]    Height:       60.0 in Accession #:    [id number]   Weight:       114.0 lb Date of Birth:  07/24/1940     BSA:          1.470 m Patient Age:    80 years     BP:           119/67 mmHg Patient Gender: F            HR:           61 bpm. Exam Location:  [facility] Procedure: 2D Echo, Cardiac Doppler and Color Doppler Indications:     Syncope R55  History:         Patient has no prior history of Echocardiogram examinations.                  Risk Factors:Hypertension.  Sonographer:     Cristela Blue RDCS (AE) Referring Phys:  [id number] CHING T TU Diagnosing Phys: Alwyn Pea MD IMPRESSIONS  1. Left ventricular ejection fraction, by estimation, is 60 to 65%. The left ventricle has normal function. The left ventricle has no regional wall motion abnormalities. Left ventricular diastolic parameters are consistent with Grade I diastolic dysfunction (impaired relaxation).  2. Right ventricular systolic function is normal. The right ventricular size is normal.  3. The mitral valve is normal in  structure. No evidence of mitral valve regurgitation.  4. The aortic valve is grossly normal. Aortic valve regurgitation is not visualized.  5. Aortic dilatation noted. There is mild dilatation of the ascending aorta. FINDINGS  Left Ventricle: Left ventricular ejection fraction, by estimation, is 60 to 65%. The left ventricle has normal function. The left ventricle has no regional wall motion abnormalities. The left ventricular internal cavity size was normal in size. There is  no left ventricular hypertrophy. Left ventricular diastolic parameters are consistent with Grade I diastolic dysfunction (impaired relaxation). Right Ventricle: The right ventricular size is normal. No increase in right ventricular wall thickness. Right ventricular systolic function is normal. Left Atrium: Left atrial size was normal in size. Right Atrium: Right atrial size was normal in size. Pericardium: Trivial pericardial effusion is present. Mitral Valve: The mitral valve is normal in structure. No evidence of mitral valve regurgitation. Tricuspid Valve: The tricuspid valve is normal in structure. Tricuspid valve regurgitation is not demonstrated. Aortic Valve: The aortic valve is grossly normal. Aortic valve regurgitation is not visualized. Aortic valve mean gradient measures 2.0 mmHg. Aortic valve peak gradient measures 3.9 mmHg. Aortic valve area, by VTI measures 1.43 cm. Pulmonic Valve: The pulmonic valve was normal in structure. Pulmonic valve regurgitation is not visualized. Aorta: The ascending aorta was not well visualized and aortic dilatation noted. There is mild dilatation of the ascending aorta. IAS/Shunts: No atrial level shunt detected by color flow Doppler.  LEFT VENTRICLE PLAX 2D LVIDd:         3.80 cm  Diastology LVIDs:         2.60 cm  LV e' medial:    5.98 cm/s LV PW:         1.10 cm  LV E/e' medial:  13.7 LV IVS:        0.90 cm  LV e' lateral:   5.77 cm/s LVOT diam:     1.50 cm  LV E/e' lateral: 14.2 LV SV:          33 LV SV Index:   23  LVOT Area:     1.77 cm  RIGHT VENTRICLE RV S prime:     13.90 cm/s TAPSE (M-mode): 3.9 cm LEFT ATRIUM             Index       RIGHT ATRIUM           Index LA diam:        2.20 cm 1.50 cm/m  RA Area:     25.50 cm LA Vol (A2C):   55.5 ml 37.76 ml/m RA Volume:   82.90 ml  56.40 ml/m LA Vol (A4C):   43.5 ml 29.59 ml/m LA Biplane Vol: 54.5 ml 37.08 ml/m  AORTIC VALVE                   PULMONIC VALVE AV Area (Vmax):    1.33 cm    PV Vmax:        0.55 m/s AV Area (Vmean):   1.16 cm    PV Peak grad:   1.2 mmHg AV Area (VTI):     1.43 cm    RVOT Peak grad: 2 mmHg AV Vmax:           98.35 cm/s AV Vmean:          71.250 cm/s AV VTI:            0.232 m AV Peak Grad:      3.9 mmHg AV Mean Grad:      2.0 mmHg LVOT Vmax:         73.80 cm/s LVOT Vmean:        46.800 cm/s LVOT VTI:          0.188 m LVOT/AV VTI ratio: 0.81  AORTA Ao Root diam: 4.00 cm MITRAL VALVE               TRICUSPID VALVE MV Area (PHT): 2.62 cm    TR Peak grad:   25.0 mmHg MV Decel Time: 289 msec    TR Vmax:        250.00 cm/s MV E velocity: 81.80 cm/s MV A velocity: 93.80 cm/s  SHUNTS MV E/A ratio:  0.87        Systemic VTI:  0.19 m                            Systemic Diam: 1.50 cm Dwayne Salome Arnt MD Electronically signed by Alwyn Pea MD Signature Date/Time: 12/28/2020/5:30:46 PM    Final    DG HIP UNILAT WITH PELVIS 2-3 VIEWS RIGHT  Result Date: 12/28/2020 CLINICAL DATA:  Right knee pain in hip pain EXAM: DG HIP (WITH OR WITHOUT PELVIS) 2-3V RIGHT COMPARISON:  None FINDINGS: Both hips appear located and intact. No signs of acute fracture or dislocation. 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 IMPRESSION: Mild right hip osteoarthritis. Electronically Signed   By: Signa Kell M.D.   On: 12/28/2020 13:09   (Echo, Carotid, EGD, Colonoscopy, ERCP)    Subjective: Seen and examined on day of discharge.  Stable in no distress.  Stable for  discharge home.  Follow-up PCP and orthopedics.  Discharge Exam: Vitals:   12/29/20 0517 12/29/20 0741  BP: (!) 147/71 131/72  Pulse: 77 62  Resp: 16 16  Temp: 98.3 F (36.8 C) 97.9 F (36.6 C)  SpO2: 97% 95%   Vitals:   12/28/20 2251 12/28/20 2251 12/29/20 0517  12/29/20 0741  BP: (!) 142/66  (!) 147/71 131/72  Pulse: (!) 41 60 77 62  Resp: 16  16 16   Temp: 98.1 F (36.7 C)  98.3 F (36.8 C) 97.9 F (36.6 C)  TempSrc:      SpO2: 97% 97% 97% 95%  Weight:      Height:        General: Pt is alert, awake, not in acute distress Cardiovascular: RRR, S1/S2 +, no rubs, no gallops Respiratory: CTA bilaterally, no wheezing, no rhonchi Abdominal: Soft, NT, ND, bowel sounds + Extremities: no edema, no cyanosis    The results of significant diagnostics from this hospitalization (including imaging, microbiology, ancillary and laboratory) are listed below for reference.     Microbiology: Recent Results (from the past 240 hour(s))  Resp Panel by RT-PCR (Flu A&B, Covid) Nasopharyngeal Swab     Status: None   Collection Time: 12/27/20  6:51 PM   Specimen: Nasopharyngeal Swab; Nasopharyngeal(NP) swabs in vial transport medium  Result Value Ref Range Status   SARS Coronavirus 2 by RT PCR NEGATIVE NEGATIVE Final    Comment: (NOTE) SARS-CoV-2 target nucleic acids are NOT DETECTED.  The SARS-CoV-2 RNA is generally detectable in upper respiratory specimens during the acute phase of infection. The lowest concentration of SARS-CoV-2 viral copies this assay can detect is 138 copies/mL. A negative result does not preclude SARS-Cov-2 infection and should not be used as the sole basis for treatment or other patient management decisions. A negative result may occur with  improper specimen collection/handling, submission of specimen other than nasopharyngeal swab, presence of viral mutation(s) within the areas targeted by this assay, and inadequate number of viral copies(<138 copies/mL). A  negative result must be combined with clinical observations, patient history, and epidemiological information. The expected result is Negative.  Fact Sheet for Patients:  02/26/21  Fact Sheet for Healthcare Providers:  BloggerCourse.com  This test is no t yet approved or cleared by the [place] FDA and  has been authorized for detection and/or diagnosis of SARS-CoV-2 by FDA under an Emergency Use Authorization (EUA). This EUA will remain  in effect (meaning this test can be used) for the duration of the COVID-19 declaration under Section 564(b)(1) of the Act, 21 U.S.C.section 360bbb-3(b)(1), unless the authorization is terminated  or revoked sooner.       Influenza A by PCR NEGATIVE NEGATIVE Final   Influenza B by PCR NEGATIVE NEGATIVE Final    Comment: (NOTE) The Xpert Xpress SARS-CoV-2/FLU/RSV plus assay is intended as an aid in the diagnosis of influenza from Nasopharyngeal swab specimens and should not be used as a sole basis for treatment. Nasal washings and aspirates are unacceptable for Xpert Xpress SARS-CoV-2/FLU/RSV testing.  Fact Sheet for Patients: [place]  Fact Sheet for Healthcare Providers: BloggerCourse.com  This test is not yet approved or cleared by the [place] FDA and has been authorized for detection and/or diagnosis of SARS-CoV-2 by FDA under an Emergency Use Authorization (EUA). This EUA will remain in effect (meaning this test can be used) for the duration of the COVID-19 declaration under Section 564(b)(1) of the Act, 21 U.S.C. section 360bbb-3(b)(1), unless the authorization is terminated or revoked.  Performed at [facility], [address] [place], [place]      Labs: BNP (last 3 results) No results for input(s): BNP in the last 8760 hours. Basic Metabolic Panel: Recent Labs  Lab 12/27/20 1414   NA 132*  K 3.9  CL 98  CO2  26  GLUCOSE 118*  BUN 18  CREATININE 0.76  CALCIUM 9.5   Liver Function Tests: No results for input(s): AST, ALT, ALKPHOS, BILITOT, PROT, ALBUMIN in the last 168 hours. No results for input(s): LIPASE, AMYLASE in the last 168 hours. No results for input(s): AMMONIA in the last 168 hours. CBC: Recent Labs  Lab 12/27/20 1414  WBC 9.1  HGB 12.6  HCT 35.0*  MCV 90.4  PLT 213   Cardiac Enzymes: No results for input(s): CKTOTAL, CKMB, CKMBINDEX, TROPONINI in the last 168 hours. BNP: Invalid input(s): POCBNP CBG: Recent Labs  Lab 12/29/20 0522  GLUCAP 87   D-Dimer No results for input(s): DDIMER in the last 72 hours. Hgb A1c No results for input(s): HGBA1C in the last 72 hours. Lipid Profile No results for input(s): CHOL, HDL, LDLCALC, TRIG, CHOLHDL, LDLDIRECT in the last 72 hours. Thyroid function studies Recent Labs    12/28/20 0421  TSH 1.855   Anemia work up No results for input(s): VITAMINB12, FOLATE, FERRITIN, TIBC, IRON, RETICCTPCT in the last 72 hours. Urinalysis    Component Value Date/Time   COLORURINE YELLOW (A) 12/27/2020 1414   APPEARANCEUR CLEAR (A) 12/27/2020 1414   APPEARANCEUR Clear 11/26/2011 0511   LABSPEC 1.014 12/27/2020 1414   LABSPEC 1.015 11/26/2011 0511   PHURINE 6.0 12/27/2020 1414   GLUCOSEU NEGATIVE 12/27/2020 1414   GLUCOSEU Negative 11/26/2011 0511   HGBUR NEGATIVE 12/27/2020 1414   BILIRUBINUR NEGATIVE 12/27/2020 1414   BILIRUBINUR neg 12/29/2018 1523   BILIRUBINUR Negative 11/26/2011 0511   KETONESUR 5 (A) 12/27/2020 1414   PROTEINUR NEGATIVE 12/27/2020 1414   UROBILINOGEN 0.2 12/29/2018 1523   NITRITE NEGATIVE 12/27/2020 1414   LEUKOCYTESUR TRACE (A) 12/27/2020 1414   LEUKOCYTESUR Negative 11/26/2011 0511   Sepsis Labs Invalid input(s): PROCALCITONIN,  WBC,  LACTICIDVEN Microbiology Recent Results (from the past 240 hour(s))  Resp Panel by RT-PCR (Flu A&B, Covid) Nasopharyngeal Swab      Status: None   Collection Time: 12/27/20  6:51 PM   Specimen: Nasopharyngeal Swab; Nasopharyngeal(NP) swabs in vial transport medium  Result Value Ref Range Status   SARS Coronavirus 2 by RT PCR NEGATIVE NEGATIVE Final    Comment: (NOTE) SARS-CoV-2 target nucleic acids are NOT DETECTED.  The SARS-CoV-2 RNA is generally detectable in upper respiratory specimens during the acute phase of infection. The lowest concentration of SARS-CoV-2 viral copies this assay can detect is 138 copies/mL. A negative result does not preclude SARS-Cov-2 infection and should not be used as the sole basis for treatment or other patient management decisions. A negative result may occur with  improper specimen collection/handling, submission of specimen other than nasopharyngeal swab, presence of viral mutation(s) within the areas targeted by this assay, and inadequate number of viral copies(<138 copies/mL). A negative result must be combined with clinical observations, patient history, and epidemiological information. The expected result is Negative.  Fact Sheet for Patients:  BloggerCourse.com  Fact Sheet for Healthcare Providers:  [employer]  This test is no t yet approved or cleared by the [place] FDA and  has been authorized for detection and/or diagnosis of SARS-CoV-2 by FDA under an Emergency Use Authorization (EUA). This EUA will remain  in effect (meaning this test can be used) for the duration of the COVID-19 declaration under Section 564(b)(1) of the Act, 21 U.S.C.section 360bbb-3(b)(1), unless the authorization is terminated  or revoked sooner.       Influenza A by PCR NEGATIVE NEGATIVE Final   Influenza B by PCR  NEGATIVE NEGATIVE Final    Comment: (NOTE) The Xpert Xpress SARS-CoV-2/FLU/RSV plus assay is intended as an aid in the diagnosis of influenza from Nasopharyngeal swab specimens and should not be used as a sole basis  for treatment. Nasal washings and aspirates are unacceptable for Xpert Xpress SARS-CoV-2/FLU/RSV testing.  Fact Sheet for Patients: BloggerCourse.com  Fact Sheet for Healthcare Providers: [employer]  This test is not yet approved or cleared by the [place] FDA and has been authorized for detection and/or diagnosis of SARS-CoV-2 by FDA under an Emergency Use Authorization (EUA). This EUA will remain in effect (meaning this test can be used) for the duration of the COVID-19 declaration under Section 564(b)(1) of the Act, 21 U.S.C. section 360bbb-3(b)(1), unless the authorization is terminated or revoked.  Performed at [facility], [address] [place], [place] [address]      Time coordinating discharge: Over 30 minutes  SIGNED:   Tresa Pratte, MD  [facility] 12/29/2020, 9:40 AM Pager   If 7PM-7AM, please contact night-coverage

## 2021-01-02 ENCOUNTER — Encounter: Admission: RE | Payer: Self-pay | Source: Home / Self Care

## 2021-01-02 ENCOUNTER — Ambulatory Visit: Admission: RE | Admit: 2021-01-02 | Payer: Medicare PPO | Source: Home / Self Care | Admitting: Surgery

## 2021-01-02 SURGERY — REPAIR, TENDON, QUADRICEPS
Anesthesia: Choice | Site: Hip | Laterality: Left

## 2021-01-31 ENCOUNTER — Encounter: Payer: Medicare PPO | Admitting: Obstetrics and Gynecology

## 2021-05-16 ENCOUNTER — Other Ambulatory Visit: Payer: Self-pay | Admitting: Surgery

## 2021-05-18 ENCOUNTER — Other Ambulatory Visit
Admission: RE | Admit: 2021-05-18 | Discharge: 2021-05-18 | Disposition: A | Discharge status: Home / Self Care | Payer: Medicare PPO | Source: Ambulatory Visit | Attending: Surgery | Admitting: Surgery

## 2021-05-18 ENCOUNTER — Other Ambulatory Visit: Payer: Self-pay

## 2021-05-18 NOTE — Patient Instructions (Addendum)
Your procedure is scheduled on: Thursday May 24, 2021. Report to Day Surgery inside [facility] 2nd floor, stop by admissions desk before getting on elevator. To find out your arrival time please call [phone] between 1PM - 3PM on Wednesday May 23, 2021.  Remember: Instructions that are not followed completely may result in serious medical risk,  up to and including death, or upon the discretion of your surgeon and anesthesiologist your  surgery may need to be rescheduled.     _X__ 1. Do not eat food after midnight the night before your procedure.                 No chewing gum or hard candies. You may drink clear liquids up to 2 hours                 before you are scheduled to arrive for your surgery- DO not drink clear                 liquids within 2 hours of the start of your surgery.                 Clear Liquids include:  water, apple juice without pulp, clear Gatorade, G2 or                  Gatorade Zero (avoid Red/Purple/Blue), Black Coffee or Tea (Do not add                 anything to coffee or tea).  __X__2.   Complete the "Ensure Clear Pre-surgery Clear Carbohydrate Drink" provided to you, 2 hours before arrival. **If you are diabetic you will be provided with an alternative drink, Gatorade Zero or G2.  __X__3.  On the morning of surgery brush your teeth with toothpaste and water, you                may rinse your mouth with mouthwash if you wish.  Do not swallow any toothpaste or mouthwash.     _X__ 4.  No Alcohol for 24 hours before or after surgery.   _X__ 5.  Do Not Smoke or use e-cigarettes For 24 Hours Prior to Your Surgery.                 Do not use any chewable tobacco products for at least 6 hours prior to                 Surgery.  _X__  6.  Do not use any recreational drugs (marijuana, cocaine, heroin, ecstasy, MDMA or other)                For at least one week prior to your surgery.  Combination of these drugs with  anesthesia                May have life threatening results.  __X__  7.  Notify your doctor if there is any change in your medical condition      (cold, fever, infections).     Do not wear jewelry, make-up, hairpins, clips or nail polish. Do not wear lotions, powders, or perfumes. You may wear deodorant. Do not shave 48 hours prior to surgery.  Do not bring valuables to the hospital.    [facility] is not responsible for any belongings or valuables.  Contacts, dentures or bridgework may not be worn into surgery. Leave your suitcase in the car. After surgery it may be brought to  your room. For patients admitted to the hospital, discharge time is determined by your treatment team.   Patients discharged the day of surgery will not be allowed to drive home.   Make arrangements for someone to be with you for the first 24 hours of your Same Day Discharge.   __X__ Take these medicines the morning of surgery with A SIP OF WATER:    1. None   2.   3.   4.  5.  6.  ____ Fleet Enema (as directed)   __X__ Use CHG Soap (or wipes) as directed  ____ Use Benzoyl Peroxide Gel as instructed  ____ Use inhalers on the day of surgery  ____ Stop metformin 2 days prior to surgery    ____ Take 1/2 of usual insulin dose the night before surgery. No insulin the morning          of surgery.   ____ Call your PCP, cardiologist, or Pulmonologist if taking Coumadin/Plavix/aspirin and ask when to stop before your surgery.   __X__ One Week prior to surgery- Stop Anti-inflammatories such as Ibuprofen, Aleve, Advil, Motrin, meloxicam (MOBIC), diclofenac, etodolac, ketorolac, Toradol, Daypro, piroxicam, Goody's or BC powders. OK TO USE TYLENOL IF NEEDED   __X__ Stop supplements until after surgery.    ____ Bring C-Pap to the hospital.    If you have any questions regarding your pre-procedure instructions,  Please call Pre-admit Testing at [phone]

## 2021-05-24 ENCOUNTER — Other Ambulatory Visit: Payer: Self-pay

## 2021-05-24 ENCOUNTER — Observation Stay
Admission: RE | Admit: 2021-05-24 | Discharge: 2021-05-25 | Disposition: A | Discharge status: Home / Self Care | Payer: Medicare PPO | Attending: Surgery | Admitting: Surgery

## 2021-05-24 ENCOUNTER — Ambulatory Visit: Payer: Medicare PPO | Admitting: Anesthesiology

## 2021-05-24 ENCOUNTER — Encounter
Admission: RE | Disposition: A | Discharge status: Home / Self Care | Payer: Self-pay | Source: Home / Self Care | Attending: Surgery

## 2021-05-24 ENCOUNTER — Encounter: Payer: Self-pay | Admitting: Surgery

## 2021-05-24 DIAGNOSIS — S76012A Strain of muscle, fascia and tendon of left hip, initial encounter: Principal | ICD-10-CM | POA: Diagnosis present

## 2021-05-24 DIAGNOSIS — W19XXXA Unspecified fall, initial encounter: Secondary | ICD-10-CM | POA: Diagnosis not present

## 2021-05-24 DIAGNOSIS — R2689 Other abnormalities of gait and mobility: Secondary | ICD-10-CM | POA: Diagnosis not present

## 2021-05-24 DIAGNOSIS — Z7982 Long term (current) use of aspirin: Secondary | ICD-10-CM | POA: Insufficient documentation

## 2021-05-24 DIAGNOSIS — I10 Essential (primary) hypertension: Secondary | ICD-10-CM | POA: Insufficient documentation

## 2021-05-24 DIAGNOSIS — Z87891 Personal history of nicotine dependence: Secondary | ICD-10-CM | POA: Diagnosis not present

## 2021-05-24 HISTORY — PX: QUADRICEPS TENDON REPAIR: SHX756

## 2021-05-24 SURGERY — REPAIR, TENDON, QUADRICEPS
Anesthesia: General | Site: Hip | Laterality: Left

## 2021-05-24 MED ORDER — VITAMIN D3 25 MCG PO TABS
1000.0000 [IU] | ORAL_TABLET | Freq: Every day | ORAL | Status: DC
  Filled 2021-05-24: qty 1

## 2021-05-24 MED ORDER — LABETALOL HCL 5 MG/ML IV SOLN
INTRAVENOUS | Status: AC
  Filled 2021-05-24: qty 4

## 2021-05-24 MED ORDER — ONDANSETRON HCL 4 MG/2ML IJ SOLN
INTRAMUSCULAR | Status: AC
  Filled 2021-05-24: qty 2

## 2021-05-24 MED ORDER — ONDANSETRON HCL 4 MG PO TABS: 4.0000 mg | ORAL_TABLET | Freq: Four times a day (QID) | ORAL | Status: DC | PRN

## 2021-05-24 MED ORDER — LIDOCAINE HCL (CARDIAC) PF 100 MG/5ML IV SOSY
PREFILLED_SYRINGE | INTRAVENOUS | Status: DC | PRN
  Administered 2021-05-24: 100 mg via INTRAVENOUS

## 2021-05-24 MED ORDER — KETOROLAC TROMETHAMINE 15 MG/ML IJ SOLN
INTRAMUSCULAR | Status: AC
  Administered 2021-05-24: 7.5 mg via INTRAVENOUS
  Filled 2021-05-24: qty 1

## 2021-05-24 MED ORDER — DOCUSATE SODIUM 100 MG PO CAPS: 100.0000 mg | ORAL_CAPSULE | Freq: Two times a day (BID) | ORAL | Status: DC

## 2021-05-24 MED ORDER — DOCUSATE SODIUM 100 MG PO CAPS
ORAL_CAPSULE | ORAL | Status: AC
  Administered 2021-05-24: 100 mg via ORAL
  Filled 2021-05-24: qty 1

## 2021-05-24 MED ORDER — METOCLOPRAMIDE HCL 10 MG PO TABS: 5.0000 mg | ORAL_TABLET | Freq: Three times a day (TID) | ORAL | Status: DC | PRN

## 2021-05-24 MED ORDER — HYDROCODONE-ACETAMINOPHEN 5-325 MG PO TABS: 1.0000 | ORAL_TABLET | ORAL | Status: DC | PRN

## 2021-05-24 MED ORDER — BUPIVACAINE LIPOSOME 1.3 % IJ SUSP
INTRAMUSCULAR | Status: AC
  Filled 2021-05-24: qty 20

## 2021-05-24 MED ORDER — CALCIUM CARBONATE ANTACID 500 MG PO CHEW
1200.0000 mg | CHEWABLE_TABLET | Freq: Every day | ORAL | Status: DC
  Filled 2021-05-24: qty 2.5

## 2021-05-24 MED ORDER — FENTANYL CITRATE (PF) 100 MCG/2ML IJ SOLN
25.0000 ug | INTRAMUSCULAR | Status: DC | PRN
  Administered 2021-05-24 (×5): 25 ug via INTRAVENOUS

## 2021-05-24 MED ORDER — CEFAZOLIN SODIUM-DEXTROSE 2-4 GM/100ML-% IV SOLN
INTRAVENOUS | Status: AC
  Administered 2021-05-24: 2 g via INTRAVENOUS
  Filled 2021-05-24: qty 100

## 2021-05-24 MED ORDER — ACETAMINOPHEN 500 MG PO TABS
500.0000 mg | ORAL_TABLET | Freq: Four times a day (QID) | ORAL | Status: AC
  Administered 2021-05-25 (×2): 500 mg via ORAL

## 2021-05-24 MED ORDER — BISACODYL 10 MG RE SUPP
10.0000 mg | Freq: Every day | RECTAL | Status: DC | PRN
  Filled 2021-05-24: qty 1

## 2021-05-24 MED ORDER — CHLORHEXIDINE GLUCONATE 0.12 % MT SOLN
OROMUCOSAL | Status: AC
  Filled 2021-05-24: qty 15

## 2021-05-24 MED ORDER — PROPOFOL 10 MG/ML IV BOLUS
INTRAVENOUS | Status: DC | PRN
  Administered 2021-05-24: 100 mg via INTRAVENOUS

## 2021-05-24 MED ORDER — 0.9 % SODIUM CHLORIDE (POUR BTL) OPTIME
TOPICAL | Status: DC | PRN
  Administered 2021-05-24: 450 mL

## 2021-05-24 MED ORDER — ACETAMINOPHEN 10 MG/ML IV SOLN
INTRAVENOUS | Status: AC
  Filled 2021-05-24: qty 100

## 2021-05-24 MED ORDER — BUPIVACAINE LIPOSOME 1.3 % IJ SUSP
INTRAMUSCULAR | Status: DC | PRN
  Administered 2021-05-24: 10 mL

## 2021-05-24 MED ORDER — FENTANYL CITRATE (PF) 100 MCG/2ML IJ SOLN
INTRAMUSCULAR | Status: AC
  Filled 2021-05-24: qty 2

## 2021-05-24 MED ORDER — CAPTOPRIL 25 MG PO TABS
25.0000 mg | ORAL_TABLET | Freq: Two times a day (BID) | ORAL | Status: DC
  Administered 2021-05-24 – 2021-05-25 (×2): 25 mg via ORAL
  Filled 2021-05-24 (×3): qty 1

## 2021-05-24 MED ORDER — PSYLLIUM 95 % PO PACK
1.0000 | PACK | Freq: Every day | ORAL | Status: DC
  Filled 2021-05-24: qty 1

## 2021-05-24 MED ORDER — ACETAMINOPHEN 500 MG PO TABS
ORAL_TABLET | ORAL | Status: AC
  Administered 2021-05-24: 500 mg via ORAL
  Filled 2021-05-24: qty 1

## 2021-05-24 MED ORDER — ESMOLOL HCL 100 MG/10ML IV SOLN
INTRAVENOUS | Status: DC | PRN
  Administered 2021-05-24: 20 mg via INTRAVENOUS

## 2021-05-24 MED ORDER — PROPOFOL 10 MG/ML IV BOLUS
INTRAVENOUS | Status: AC
  Filled 2021-05-24: qty 20

## 2021-05-24 MED ORDER — CHLORHEXIDINE GLUCONATE 0.12 % MT SOLN
15.0000 mL | Freq: Once | OROMUCOSAL | Status: AC
  Administered 2021-05-24: 15 mL via OROMUCOSAL

## 2021-05-24 MED ORDER — ONDANSETRON HCL 4 MG/2ML IJ SOLN: 4.0000 mg | Freq: Four times a day (QID) | INTRAMUSCULAR | Status: DC | PRN

## 2021-05-24 MED ORDER — MAGNESIUM HYDROXIDE 400 MG/5ML PO SUSP: 30.0000 mL | Freq: Every day | ORAL | Status: DC | PRN

## 2021-05-24 MED ORDER — PROMETHAZINE HCL 25 MG/ML IJ SOLN
INTRAMUSCULAR | Status: AC
  Filled 2021-05-24: qty 1

## 2021-05-24 MED ORDER — SODIUM CHLORIDE FLUSH 0.9 % IV SOLN
INTRAVENOUS | Status: AC
  Filled 2021-05-24: qty 40

## 2021-05-24 MED ORDER — ENOXAPARIN SODIUM 40 MG/0.4ML IJ SOSY: 40.0000 mg | PREFILLED_SYRINGE | INTRAMUSCULAR | Status: DC

## 2021-05-24 MED ORDER — ESTROGENS CONJUGATED 0.625 MG/GM VA CREA
1.0000 | TOPICAL_CREAM | VAGINAL | Status: DC
  Filled 2021-05-24: qty 30

## 2021-05-24 MED ORDER — FLEET ENEMA 7-19 GM/118ML RE ENEM: 1.0000 | ENEMA | Freq: Once | RECTAL | Status: DC | PRN

## 2021-05-24 MED ORDER — VITAMIN B-6 50 MG PO TABS
50.0000 mg | ORAL_TABLET | Freq: Every day | ORAL | Status: DC
  Filled 2021-05-24: qty 1

## 2021-05-24 MED ORDER — CEFAZOLIN SODIUM-DEXTROSE 2-4 GM/100ML-% IV SOLN
2.0000 g | INTRAVENOUS | Status: AC
  Administered 2021-05-24 – 2021-05-25 (×2): 2 g via INTRAVENOUS

## 2021-05-24 MED ORDER — ACETAMINOPHEN 325 MG PO TABS: 325.0000 mg | ORAL_TABLET | Freq: Four times a day (QID) | ORAL | Status: DC | PRN

## 2021-05-24 MED ORDER — FENTANYL CITRATE (PF) 100 MCG/2ML IJ SOLN
INTRAMUSCULAR | Status: DC | PRN
  Administered 2021-05-24 (×2): 50 ug via INTRAVENOUS

## 2021-05-24 MED ORDER — METOCLOPRAMIDE HCL 5 MG/ML IJ SOLN: 5.0000 mg | Freq: Three times a day (TID) | INTRAMUSCULAR | Status: DC | PRN

## 2021-05-24 MED ORDER — PHENYLEPHRINE HCL (PRESSORS) 10 MG/ML IV SOLN
INTRAVENOUS | Status: DC | PRN
  Administered 2021-05-24: 160 ug via INTRAVENOUS

## 2021-05-24 MED ORDER — LIDOCAINE HCL (PF) 2 % IJ SOLN
INTRAMUSCULAR | Status: AC
  Filled 2021-05-24: qty 5

## 2021-05-24 MED ORDER — METOCLOPRAMIDE HCL 5 MG/ML IJ SOLN
INTRAMUSCULAR | Status: AC
  Administered 2021-05-24: 10 mg via INTRAVENOUS
  Filled 2021-05-24: qty 2

## 2021-05-24 MED ORDER — CEFAZOLIN SODIUM-DEXTROSE 2-4 GM/100ML-% IV SOLN
INTRAVENOUS | Status: AC
  Administered 2021-05-25: 2 g via INTRAVENOUS
  Filled 2021-05-24: qty 100

## 2021-05-24 MED ORDER — FAMOTIDINE 20 MG PO TABS
ORAL_TABLET | ORAL | Status: AC
  Filled 2021-05-24: qty 1

## 2021-05-24 MED ORDER — LABETALOL HCL 5 MG/ML IV SOLN
5.0000 mg | INTRAVENOUS | Status: DC | PRN
  Administered 2021-05-24: 5 mg via INTRAVENOUS

## 2021-05-24 MED ORDER — FAMOTIDINE 20 MG PO TABS
20.0000 mg | ORAL_TABLET | Freq: Once | ORAL | Status: AC
  Administered 2021-05-24: 20 mg via ORAL

## 2021-05-24 MED ORDER — SUGAMMADEX SODIUM 200 MG/2ML IV SOLN
INTRAVENOUS | Status: DC | PRN
  Administered 2021-05-24: 110 mg via INTRAVENOUS

## 2021-05-24 MED ORDER — ACETAMINOPHEN 10 MG/ML IV SOLN
INTRAVENOUS | Status: DC | PRN
  Administered 2021-05-24: 500 mg via INTRAVENOUS

## 2021-05-24 MED ORDER — BUPIVACAINE-EPINEPHRINE (PF) 0.5% -1:200000 IJ SOLN
INTRAMUSCULAR | Status: AC
  Filled 2021-05-24: qty 30

## 2021-05-24 MED ORDER — ROCURONIUM BROMIDE 100 MG/10ML IV SOLN
INTRAVENOUS | Status: DC | PRN
  Administered 2021-05-24: 30 mg via INTRAVENOUS

## 2021-05-24 MED ORDER — VITAMIN B-12 1000 MCG PO TABS
3000.0000 ug | ORAL_TABLET | Freq: Every day | ORAL | Status: DC
  Filled 2021-05-24: qty 3

## 2021-05-24 MED ORDER — LACTATED RINGERS IV SOLN: INTRAVENOUS | Status: DC

## 2021-05-24 MED ORDER — ESMOLOL HCL 100 MG/10ML IV SOLN
INTRAVENOUS | Status: AC
  Filled 2021-05-24: qty 10

## 2021-05-24 MED ORDER — CEFAZOLIN SODIUM-DEXTROSE 2-4 GM/100ML-% IV SOLN: 2.0000 g | Freq: Four times a day (QID) | INTRAVENOUS | Status: AC

## 2021-05-24 MED ORDER — MORPHINE SULFATE (PF) 4 MG/ML IV SOLN: 0.5000 mg | INTRAVENOUS | Status: DC | PRN

## 2021-05-24 MED ORDER — ORAL CARE MOUTH RINSE: 15.0000 mL | Freq: Once | OROMUCOSAL | Status: AC

## 2021-05-24 MED ORDER — SODIUM CHLORIDE 0.9 % IV SOLN: INTRAVENOUS | Status: DC

## 2021-05-24 MED ORDER — BUPIVACAINE-EPINEPHRINE 0.5% -1:200000 IJ SOLN
INTRAMUSCULAR | Status: DC | PRN
Start: 2021-05-24 — End: 2021-05-24
  Administered 2021-05-24: 30 mL

## 2021-05-24 MED ORDER — DIPHENHYDRAMINE HCL 12.5 MG/5ML PO ELIX
12.5000 mg | ORAL_SOLUTION | ORAL | Status: DC | PRN
  Filled 2021-05-24: qty 10

## 2021-05-24 MED ORDER — PROMETHAZINE HCL 25 MG/ML IJ SOLN
6.2500 mg | INTRAMUSCULAR | Status: DC | PRN
  Administered 2021-05-24: 6.25 mg via INTRAVENOUS

## 2021-05-24 MED ORDER — PHENYLEPHRINE HCL-NACL 20-0.9 MG/250ML-% IV SOLN
INTRAVENOUS | Status: DC | PRN
  Administered 2021-05-24: 50 ug/min via INTRAVENOUS

## 2021-05-24 MED ORDER — ONDANSETRON HCL 4 MG/2ML IJ SOLN
4.0000 mg | Freq: Once | INTRAMUSCULAR | Status: AC | PRN
  Administered 2021-05-24: 4 mg via INTRAVENOUS

## 2021-05-24 MED ORDER — SODIUM CHLORIDE FLUSH 0.9 % IV SOLN
INTRAVENOUS | Status: AC
  Filled 2021-05-24: qty 20

## 2021-05-24 MED ORDER — KETOROLAC TROMETHAMINE 15 MG/ML IJ SOLN
7.5000 mg | Freq: Four times a day (QID) | INTRAMUSCULAR | Status: AC
  Administered 2021-05-25 (×2): 7.5 mg via INTRAVENOUS

## 2021-05-24 MED ORDER — ONDANSETRON HCL 4 MG/2ML IJ SOLN
INTRAMUSCULAR | Status: DC | PRN
Start: 2021-05-24 — End: 2021-05-24
  Administered 2021-05-24: 4 mg via INTRAVENOUS

## 2021-05-24 SURGICAL SUPPLY — 50 items
ANCHOR ALL-SUT Q-FIX 2.8 (Anchor) ×3 IMPLANT
ANCHOR HEALICOIL REGEN 5.5 (Anchor) ×3 IMPLANT
BLADE SURG SZ10 CARB STEEL (BLADE) ×4 IMPLANT
BNDG COHESIVE 4X5 TAN ST LF (GAUZE/BANDAGES/DRESSINGS) ×2 IMPLANT
BNDG ESMARK 6X12 TAN STRL LF (GAUZE/BANDAGES/DRESSINGS) ×1 IMPLANT
CHLORAPREP W/TINT 26 (MISCELLANEOUS) ×3 IMPLANT
DRAPE 3/4 80X56 (DRAPES) ×2 IMPLANT
DRAPE INCISE IOBAN 66X45 STRL (DRAPES) ×2 IMPLANT
DRAPE ORTHO SPLIT 77X108 STRL (DRAPES) ×2
DRAPE SURG 17X11 SM STRL (DRAPES) ×4 IMPLANT
DRAPE SURG ORHT 6 SPLT 77X108 (DRAPES) ×2 IMPLANT
DRSG OPSITE POSTOP 4X12 (GAUZE/BANDAGES/DRESSINGS) ×1 IMPLANT
DRSG OPSITE POSTOP 4X8 (GAUZE/BANDAGES/DRESSINGS) ×1 IMPLANT
ELECT REM PT RETURN 9FT ADLT (ELECTROSURGICAL) ×2
ELECTRODE REM PT RTRN 9FT ADLT (ELECTROSURGICAL) ×1 IMPLANT
GAUZE 4X4 16PLY ~~LOC~~+RFID DBL (SPONGE) ×2 IMPLANT
GAUZE SPONGE 4X4 12PLY STRL (GAUZE/BANDAGES/DRESSINGS) ×2 IMPLANT
GAUZE XEROFORM 1X8 LF (GAUZE/BANDAGES/DRESSINGS) ×1 IMPLANT
GLOVE SURG ENC MOIS LTX SZ8 (GLOVE) ×4 IMPLANT
GLOVE SURG UNDER LTX SZ8 (GLOVE) ×2 IMPLANT
GOWN STRL REUS W/ TWL LRG LVL3 (GOWN DISPOSABLE) ×2 IMPLANT
GOWN STRL REUS W/ TWL XL LVL3 (GOWN DISPOSABLE) ×1 IMPLANT
GOWN STRL REUS W/TWL LRG LVL3 (GOWN DISPOSABLE) ×2
GOWN STRL REUS W/TWL XL LVL3 (GOWN DISPOSABLE) ×1
HANDLE YANKAUER SUCT BULB TIP (MISCELLANEOUS) ×2 IMPLANT
KIT SUTURE 2.8 Q-FIX DISP (MISCELLANEOUS) ×1 IMPLANT
KIT TURNOVER KIT A (KITS) ×2 IMPLANT
MANIFOLD NEPTUNE II (INSTRUMENTS) ×2 IMPLANT
NDL MAYO 6 CRC TAPER PT (NEEDLE) IMPLANT
NDL MAYO CATGUT SZ1 (NEEDLE) ×1 IMPLANT
NEEDLE HYPO 22GX1.5 SAFETY (NEEDLE) ×1 IMPLANT
NEEDLE MAYO 6 CRC TAPER PT (NEEDLE) ×2 IMPLANT
NEEDLE MAYO CATGUT SZ1 (NEEDLE) IMPLANT
NS IRRIG 500ML POUR BTL (IV SOLUTION) ×1 IMPLANT
PACK EXTREMITY ARMC (MISCELLANEOUS) ×2 IMPLANT
RETRIEVER SUT HEWSON (MISCELLANEOUS) ×1 IMPLANT
SPONGE T-LAP 18X18 ~~LOC~~+RFID (SPONGE) ×2 IMPLANT
STAPLER SKIN PROX 35W (STAPLE) ×2 IMPLANT
STOCKINETTE IMPERVIOUS 9X36 MD (GAUZE/BANDAGES/DRESSINGS) ×2 IMPLANT
SUT ETHIBOND #5 BRAIDED 30INL (SUTURE) ×1 IMPLANT
SUT ETHIBOND 0 MO6 C/R (SUTURE) ×1 IMPLANT
SUT FIBERWIRE #2 38 BLUE 1/2 (SUTURE)
SUT VIC AB 0 CT1 36 (SUTURE) ×3 IMPLANT
SUT VIC AB 2-0 CT1 27 (SUTURE) ×2
SUT VIC AB 2-0 CT1 TAPERPNT 27 (SUTURE) ×2 IMPLANT
SUT VIC AB 2-0 CT2 27 (SUTURE) ×2 IMPLANT
SUT VIC AB 3-0 SH 27 (SUTURE)
SUT VIC AB 3-0 SH 27X BRD (SUTURE) ×2 IMPLANT
SUTURE FIBERWR #2 38 BLUE 1/2 (SUTURE) IMPLANT
WATER STERILE IRR 500ML POUR (IV SOLUTION) ×2 IMPLANT

## 2021-05-24 NOTE — Progress Notes (Signed)
PT Cancellation Note  Patient Details Name: Tracy Herman MRN: [id number] DOB: 1941-04-26   Cancelled Treatment:    Reason Eval/Treat Not Completed: Patient not medically ready (Consult received and chart reviewed.  Patient with persistant nausea/heaving, unrelieved with medication per RN. Unable to tolerate evaluation at this time.  Will re-attempt in AM as medically appropriate and available.)   Teodora Baumgarten H. Manson Passey, PT, DPT, NCS 05/24/21, 4:46 PM [phone]

## 2021-05-24 NOTE — H&P (Signed)
History of Present Illness:  Tracy Herman is a 81 y.o. female who presents today to discuss her ongoing left lateral hip pain. The patient has been seen in the past and has undergone a left hip MRI scan which is detailed in the imaging section below. This MRI scan did demonstrate evidence of gluteus medius tearing in addition to some other underlying degenerative tearing of the gluteus minimus and maximus with some underlying muscle atrophy. The patient was scheduled to undergo a left repair of gluteus medius tendon when she suffered a fall in which she suffered a right distal fibula fracture and was treated with a walking boot and gradually increasing weightbearing. Because of her right ankle injury surgery was delayed, the patient is now fully recovered from her right ankle injury and presents today to discuss the undergoing surgery. She reports continued pain along the lateral aspect of the left hip. She reports her pain score to be as high as an 8 out of 10 with certain activities such as prolonged periods of walking. She denies any pain that radiates into the anterior aspect of the leg, she denies any numbness or tingling to the left lower extremity. She has increased discomfort Menz sleep on the left side at night and describes the pain as a sharp discomfort along the lateral aspect of the hip. She does use a cane for assistance with ambulation. She denies any personal history of heart attack, stroke, asthma or COPD. No personal history of blood clots.  Current Outpatient Medications:  acetaminophen (TYLENOL) 500 MG tablet Take by mouth Take 500-1,000 mg by mouth every 6 (six) hours as needed for moderate pain or mild pain.   aspirin 81 MG EC tablet Take 81 mg by mouth once daily.   calcium carbonate 1250 MG capsule Take by mouth   calcium carbonate 500 mg calcium (1,250 mg) tablet Take 1 tablet by mouth once daily   captopriL (CAPOTEN) 25 MG tablet Take 1 tablet (25 mg total) by mouth 2 (two) times  daily 180 tablet 1   cholecalciferol (VITAMIN D3) 1000 unit tablet Take by mouth   conjugated estrogens (PREMARIN) 0.625 mg/gram vaginal cream Place vaginally twice a week.   cyanocobalamin (VITAMIN B12) 100 MCG tablet Take by mouth   triamterene-hydrochlorothiazide (DYAZIDE) 37.5-25 mg capsule TAKE 1 CAPSULE BY MOUTH EVERY MORNING 90 capsule 3   Allergies:   Lisinopril Other (depression)   Past Medical History:   Allergic rhinitis   Depression (Stable)   Hemorrhoids   History of polycystic kidney disease with normal renal function   Hyperlipidemia   Hypertension   Shingles   Past Surgical History:   Bilateral breast reduction July 1992   COLONOSCOPY 12/27/2008   Bladder surgery 1980s (Unknown type, done by Dr. Orson Slick)   Bowel resection 1977   Foot surgery 2000   HYSTERECTOMY (Noncancerous reasons)   Pelvic repair surgery 2013   Family History:   High blood pressure (Hypertension) Mother   Parkinsonism Mother   Social History:   Socioeconomic History:   Marital status: Married  Tobacco Use   Smoking status: Former  Types: Cigarettes  Quit date: 01/18/1976  Years since quitting: 45.3   Smokeless tobacco: Never  Substance and Sexual Activity   Alcohol use: Yes  Alcohol/week: 0.0 standard drinks  Comment: Socially.   Review of Systems:  A comprehensive 14 point ROS was performed, reviewed, and the pertinent orthopaedic findings are documented in the HPI.  Physical Exam: Vitals:  05/11/21 1157  BP:  130/80  Weight: 50.1 kg (110 lb 6.4 oz)  Height: 160 cm (5\' 3" )  PainSc: 8  PainLoc: Hip   General/Constitutional: The patient appears to be well-nourished, well-developed, and in no acute distress. Neuro/Psych: Normal mood and affect, oriented to person, place and time. Eyes: Non-icteric. Pupils are equal, round, and reactive to light, and exhibit synchronous movement. ENT: Unremarkable. Lymphatic: No palpable adenopathy. Respiratory: Lungs clear to auscultation,  Normal chest excursion, No wheezes and Non-labored breathing Cardiovascular: Regular rate and rhythm with soft systolic ejection murmur and No edema, swelling or tenderness, except as noted in detailed exam. Integumentary: No impressive skin lesions present, except as noted in detailed exam. Musculoskeletal: Unremarkable, except as noted in detailed exam.  Left hip exam: The patient ambulates with an antalgic and obvious Trendelenburg gait, favoring her left leg, and uses a cane for balance and support. Skin inspection of the left hip is unremarkable. No swelling, erythema, ecchymosis, abrasions, or other skin abnormalities are identified. She has moderate tenderness to palpation of the left trochanteric region. In stance, her pelvis again is uneven with the left hemipelvis being slightly higher than her right. She is able to heel raise and toe raise without difficulty, but is unable to march in place due to her left hip abductor weakness. She has no pain with active or passive range of motion of the left hip. She is neurovascularly intact to both lower extremities and demonstrates negative sitting straight leg raises bilaterally.  X-rays/MRI/Lab data:  MR OF THE LEFT HIP:  1. Mild age related degenerative changes involving both hips, left  greater than right. No stress fracture or AVN.  2. Labral degenerative changes and tear involving the superior  anterior labrum.  3. Significant bilateral peritendinitis, left greater than right.  Suspect partial chronic tear involving the gluteus medius tendon  with diffuse fatty atrophy the muscle. Similar but less significant  findings involving the gluteus minimus and gluteus maximus.  4. Mild bilateral hamstring tendinopathy, right greater than left.  5. Markedly enlarged multi cystic kidneys consistent with known  adult polycystic kidney disease.   Assessment:  Tear of left gluteus medius tendon.   Plan: 1. Treatment options were discussed today  with the patient. 2. The patient does still experience moderate discomfort over the lateral aspect of the hip with obvious weakness. 3. Did discuss both risk and benefits of both surgical and nonsurgical intervention. Instructed the patient on the difficult recovery of surgery and the need to be toe-touch weightbearing to the left lower extremity and wear a left hip abduction brace. 4. After discussion of the known risk and benefits the patient would like to proceed with a open repair of left gluteus medius tendon. Surgery will be performed by Dr. . 5. This document will serve as a surgical history and physical for the patient. The patient will follow-up per standard postop protocol. 6. The patient and family can contact the clinic if she has any questions, new symptoms develop or symptoms worsen.  The procedure was discussed with the patient, as were the potential risks (including bleeding, infection, nerve and/or blood vessel injury, persistent or recurrent pain, failure of the repair, progression of arthritis, need for further surgery, blood clots, strokes, heart attacks and/or arhythmias, pneumonia, etc.) and benefits. The patient states her understanding and wishes to proceed.   H&P reviewed and patient re-examined. No changes.

## 2021-05-24 NOTE — Progress Notes (Signed)
Patient has been having lots of nausea and dry heaves in PACU, notified both Dr. Joice Lofts and Dr. Laural Benes anesthesia on call. Patients pain [place] more controlled at this time. Also notified Dr. Laural Benes of continued elevated blood pressure. Labetalol IV ordered and administered. Notified Dr. Joice Lofts that physical therapy is recommending her to stay another night for observation, falling asleep while physical therapy is evaluating her.

## 2021-05-24 NOTE — Transfer of Care (Signed)
Immediate Anesthesia Transfer of Care Note  Patient: Tracy Herman  Procedure(s) Performed: OPEN REPAIR OF CHRONIC LEFT GLUTEUS MEDIUS TENDON TEAR (Left: Hip)  Patient Location: PACU  Anesthesia Type:General  Level of Consciousness: awake, alert  and oriented  Airway & Oxygen Therapy: Patient Spontanous Breathing and Patient connected to nasal cannula oxygen  Post-op Assessment: Report given to RN and Post -op Vital signs reviewed and stable  Post vital signs: stable  Last Vitals:  Vitals Value Taken Time  BP 184/97 05/24/21 1448  Temp    Pulse 73 05/24/21 1452  Resp 14 05/24/21 1452  SpO2 100 % 05/24/21 1452  Vitals shown include unvalidated device data.  Last Pain:  Vitals:   05/24/21 1227  TempSrc: Oral  PainSc: 5          Complications: No notable events documented.

## 2021-05-24 NOTE — Progress Notes (Signed)
Notified Dr. Roland Rack that patient will most likely need a prescription for nausea at home.

## 2021-05-24 NOTE — Anesthesia Preprocedure Evaluation (Signed)
Anesthesia Evaluation  Patient identified by MRN, date of birth, ID band Patient awake    Reviewed: Allergy & Precautions, NPO status , Patient's Chart, lab work & pertinent test results  History of Anesthesia Complications Negative for: history of anesthetic complications  Airway Mallampati: II  TM Distance: >3 FB Neck ROM: Full    Dental  (+) Chipped   Pulmonary neg pulmonary ROS, neg sleep apnea, neg COPD, Patient abstained from smoking.Not current smoker, former smoker,    Pulmonary exam normal breath sounds clear to auscultation       Cardiovascular Exercise Tolerance: Good METShypertension, Pt. on medications (-) CAD and (-) Past MI (-) dysrhythmias  Rhythm:Regular Rate:Normal - Systolic murmurs    Neuro/Psych PSYCHIATRIC DISORDERS Depression negative neurological ROS     GI/Hepatic neg GERD  ,(+)     (-) substance abuse  ,   Endo/Other  neg diabetes  Renal/GU Renal disease     Musculoskeletal   Abdominal   Peds  Hematology   Anesthesia Other Findings Past Medical History: No date: AR (allergic rhinitis) No date: Bursitis No date: Cystocele No date: Depression No date: Hyperlipemia No date: Hypertension No date: Menopause No date: PKD (polycystic kidney disease) No date: Rectocele  Reproductive/Obstetrics                             Anesthesia Physical Anesthesia Plan  ASA: 2  Anesthesia Plan: General   Post-op Pain Management: Ofirmev IV (intra-op)   Induction: Intravenous  PONV Risk Score and Plan: 3 and Ondansetron, Dexamethasone and Treatment may vary due to age or medical condition  Airway Management Planned: Oral ETT  Additional Equipment: None  Intra-op Plan:   Post-operative Plan: Extubation in OR  Informed Consent: I have reviewed the patients History and Physical, chart, labs and discussed the procedure including the risks, benefits and alternatives  for the proposed anesthesia with the patient or authorized representative who has indicated his/her understanding and acceptance.     Dental advisory given  Plan Discussed with: CRNA and Surgeon  Anesthesia Plan Comments: (Discussed risks of anesthesia with patient, including PONV, sore throat, lip/dental/eye damage. Rare risks discussed as well, such as cardiorespiratory and neurological sequelae, and allergic reactions. Discussed the role of CRNA in patient's perioperative care. Patient understands.)        Anesthesia Quick Evaluation

## 2021-05-24 NOTE — Discharge Instructions (Addendum)
Orthopedic discharge instructions: May sponge bathe with intact OpSite dressing. Apply ice frequently to hip. Keep hip brace on at all times, loosening only for bathing purposes. Take ibuprofen 600 mg TID with meals for 5-7 days, then as necessary. Take hydrocodone as prescribed or ES Tylenol if necessary. Partial weight-bear on left leg - use walker for balance and support. Follow-up in 10-14 days or as scheduled.  AMBULATORY SURGERY  DISCHARGE INSTRUCTIONS   The drugs that you were given will stay in your system until tomorrow so for the next 24 hours you should not:  Drive an automobile Make any legal decisions Drink any alcoholic beverage   You may resume regular meals tomorrow.  Today it is better to start with liquids and gradually work up to solid foods.  You may eat anything you prefer, but it is better to start with liquids, then soup and crackers, and gradually work up to solid foods.   Please notify your doctor immediately if you have any unusual bleeding, trouble breathing, redness and pain at the surgery site, drainage, fever, or pain not relieved by medication.    Additional Instructions:        Please contact your physician with any problems or Same Day Surgery at [phone], Monday through Friday 6 am to 4 pm, or [place] at [facility] number at [phone].

## 2021-05-24 NOTE — Op Note (Signed)
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  Post-Op Diagnosis:   Same.  Procedure:   Primary repair of chronic gluteus medius and minimus tendon tears, left hip.  Surgeon:   Pascal Lux, MD  Assistant:   Cameron Proud, PA-C; Rocco Pauls, PA-S  Anesthesia:   GET  Findings:   As above.  Complications:   None  EBL:   25 cc  Fluids:   1000 cc crystalloid  UOP:   None  TT:   None  Drains:   None  Closure:   Staples  Brief Clinical Note:   The patient is a 81 year old female with a long history of chronic lateral sided left hip pain.  Her symptoms have persisted despite medications, activity modification, etc.  Her history and examination are consistent with chronic gluteus medius and minimus tendon tears, confirmed by preoperative MRI scanning.  The patient presents at this time for repairs of the left gluteus minimus tendons.   Procedure:   The patient was brought into the operating room and laid in the supine position.  After adequate general endotracheal intubation and anesthesia were obtained, the patient was repositioned in the right lateral decubitus position and secured using a lateral hip positioner.  The left hip and lower extremity were prepped with ChloroPrep solution before being draped sterilely.  Preoperative antibiotics were administered.  A timeout was performed to verify the appropriate surgical site.    A standard posterior approach to the hip was made through an approximately 4-5 inch incision.  The incision was carried down through the subcutaneous tissues to expose the gluteal fascia and proximal end of the iliotibial band.  These structures were split the length of the incision and the [place] self-retaining hip retractor placed.  The gluteus medius and minimus tendons were identified and dissected free from surrounding adhesed tissues.  These two tendons were noted to have torn  off the anterior and proximal portions of the greater trochanter, leaving only an approximately 1 cm wide portion of the gluteus medius tendon intact posteriorly.  The bursal tissues were chronically thickened and shredded, and were debrided.   The exposed portion of the greater trochanter was debrided back to bleeding bone using a rongeur.  Three [employer] 2.8 mm Q fix anchors were inserted at the 10:00, 12:00, and 2:00 positions.  Each of these sutures were woven through the torn portion of the gluteus medius and minimus tendons.  After slightly abducting the hip to minimize stress on the repair, each of these sets of sutures were tied securely.  The suture limbs were then brought back laterally/distally and secured using two [place] knotless RegeneSorb anchors to create a transosseous equivalent closure.  The wound was copiously irrigated with sterile saline solution using bulb irrigation before the peri-incisional and pericapsular tissues were injected with 30 cc of 0.5% Sensorcaine with epinephrine and 20 cc of Exparel. The iliotibial band was reapproximated using #0 Vicryl interrupted sutures. The subcutaneous tissues were closed in several layers using 2-0 Vicryl interrupted sutures before the skin was closed using staples. A sterile occlusive dressing was applied to the wound. The patient was then rolled back into the supine position before being placed into a hip abduction brace with thigh cuff.  The patient was then awakened, extubated, and returned to the recovery room in satisfactory condition after tolerating the procedure well.

## 2021-05-24 NOTE — Progress Notes (Signed)
Patient's daughter is Uriel Horkey Cell phone is [phone] Work phone is available from [phone]. [phone]

## 2021-05-24 NOTE — Anesthesia Procedure Notes (Signed)
Procedure Name: Intubation Date/Time: 05/24/2021 12:52 PM Performed by: Natasha Mead, CRNA Pre-anesthesia Checklist: Patient identified, Emergency Drugs available, Suction available and Patient being monitored Patient Re-evaluated:Patient Re-evaluated prior to induction Oxygen Delivery Method: Circle system utilized Preoxygenation: Pre-oxygenation with 100% oxygen Induction Type: IV induction Ventilation: Mask ventilation without difficulty Laryngoscope Size: Miller and 2 Grade View: Grade I Tube type: Oral Tube size: 6.5 mm Number of attempts: 1 Airway Equipment and Method: Stylet and Oral airway Placement Confirmation: ETT inserted through vocal cords under direct vision, positive ETCO2 and breath sounds checked- equal and bilateral Secured at: 18 cm Tube secured with: Tape Dental Injury: Teeth and Oropharynx as per pre-operative assessment

## 2021-05-25 ENCOUNTER — Encounter: Payer: Self-pay | Admitting: Surgery

## 2021-05-25 DIAGNOSIS — S76012A Strain of muscle, fascia and tendon of left hip, initial encounter: Secondary | ICD-10-CM | POA: Diagnosis not present

## 2021-05-25 LAB — BASIC METABOLIC PANEL
Anion gap: 7 (ref 5–15)
BUN: 12 mg/dL (ref 8–23)
CO2: 26 mmol/L (ref 22–32)
Calcium: 8.4 mg/dL — ABNORMAL LOW (ref 8.9–10.3)
Chloride: 104 mmol/L (ref 98–111)
Creatinine, Ser: 0.76 mg/dL (ref 0.44–1.00)
GFR, Estimated: >60 mL/min (ref >60)
Glucose, Bld: 79 mg/dL (ref 70–99)
Potassium: 3.9 mmol/L (ref 3.5–5.1)
Sodium: 137 mmol/L (ref 135–145)

## 2021-05-25 MED ORDER — DOCUSATE SODIUM 100 MG PO CAPS
ORAL_CAPSULE | ORAL | Status: AC
  Administered 2021-05-25: 100 mg via ORAL
  Filled 2021-05-25: qty 1

## 2021-05-25 MED ORDER — ACETAMINOPHEN 500 MG PO TABS
ORAL_TABLET | ORAL | Status: AC
  Administered 2021-05-25: 500 mg via ORAL
  Filled 2021-05-25: qty 2

## 2021-05-25 MED ORDER — ACETAMINOPHEN 500 MG PO TABS
ORAL_TABLET | ORAL | Status: AC
  Filled 2021-05-25: qty 1

## 2021-05-25 MED ORDER — VITAMIN B-12 1000 MCG PO TABS: 3000.0000 ug | ORAL_TABLET | Freq: Every day | ORAL | Status: DC

## 2021-05-25 MED ORDER — KETOROLAC TROMETHAMINE 15 MG/ML IJ SOLN
INTRAMUSCULAR | Status: AC
  Administered 2021-05-25: 7.5 mg via INTRAVENOUS
  Filled 2021-05-25: qty 1

## 2021-05-25 MED ORDER — KETOROLAC TROMETHAMINE 15 MG/ML IJ SOLN
INTRAMUSCULAR | Status: AC
  Filled 2021-05-25: qty 1

## 2021-05-25 MED ORDER — CEFAZOLIN SODIUM-DEXTROSE 2-4 GM/100ML-% IV SOLN
INTRAVENOUS | Status: AC
  Filled 2021-05-25: qty 100

## 2021-05-25 MED ORDER — ENOXAPARIN SODIUM 40 MG/0.4ML IJ SOSY
PREFILLED_SYRINGE | INTRAMUSCULAR | Status: AC
  Administered 2021-05-25: 40 mg via SUBCUTANEOUS
  Filled 2021-05-25: qty 0.4

## 2021-05-25 NOTE — Anesthesia Postprocedure Evaluation (Signed)
Anesthesia Post Note  Patient: Tracy Herman  Procedure(s) Performed: OPEN REPAIR OF CHRONIC LEFT GLUTEUS MEDIUS TENDON TEAR (Left: Hip)  Patient location during evaluation: PACU Anesthesia Type: General Level of consciousness: awake and alert Pain management: pain level controlled Vital Signs Assessment: post-procedure vital signs reviewed and stable Respiratory status: spontaneous breathing, nonlabored ventilation, respiratory function stable and patient connected to nasal cannula oxygen Cardiovascular status: blood pressure returned to baseline and stable Postop Assessment: no apparent nausea or vomiting Anesthetic complications: no   No notable events documented.   Last Vitals:  Vitals:   05/25/21 0000 05/25/21 0400  BP: 121/66 (!) 144/67  Pulse: (!) 58 (!) 54  Resp: 16 16  Temp: (!) 36.4 C (!) 36.1 C  SpO2: 97% 99%    Last Pain:  Vitals:   05/25/21 0400  TempSrc: Temporal  PainSc:                  Corinda Gubler

## 2021-05-25 NOTE — Evaluation (Signed)
Physical Therapy Evaluation Patient Details Name: Tracy Herman MRN: [id number] DOB: 12-29-1940 Today's Date: 05/25/2021  History of Present Illness  Pt is a 89 s/p gluteus medius repair. PMH of R ankle fx, HTN, HLD, polycystic kidney disease, breast sx.  Clinical Impression  Pt alert, agreeable to PT, reported 5/10 L hip pain. She reported at baseline she does not need assist, but her husband can provide physical assist, and she helps him cognitively due to a history of dementia. Has all DME needs at home, and adamant that they have access to a temporary ramp to get in/out of the house.   The patient demonstrated bed mobility with supervision. Sit <> stand several times, supervision, cued for hand placement on RW. She was able to ambulate ~25ft total, excellent adherence to PWB status. Pt and PT verbally reviewed [facility] navigation in/out of the house.  Overall the patient demonstrated deficits (see "PT Problem List") that impede the patient's functional abilities, safety, and mobility and would benefit from skilled PT intervention. Recommendation is HHPT with intermittent supervision/assistance.        Recommendations for follow up therapy are one component of a multi-disciplinary discharge planning process, led by the attending physician.  Recommendations may be updated based on patient status, additional functional criteria and insurance authorization.  Follow Up Recommendations Home health PT    Assistance Recommended at Discharge Intermittent Supervision/Assistance  Patient can return home with the following  Assistance with cooking/housework;Assist for transportation;Help with stairs or ramp for entrance    Equipment Recommendations None recommended by PT  Recommendations for Other Services       Functional Status Assessment Patient has had a recent decline in their functional status and demonstrates the ability to make significant improvements in function in a reasonable and predictable  amount of time.     Precautions / Restrictions Precautions Precautions: Fall Required Braces or Orthoses: Other Brace Other Brace: abduction brace Restrictions Weight Bearing Restrictions: Yes LLE Weight Bearing: Partial weight bearing LLE Partial Weight Bearing Percentage or Pounds: 25%      Mobility  Bed Mobility Overal bed mobility: Needs Assistance Bed Mobility: Supine to Sit     Supine to sit: Supervision;HOB elevated          Transfers Overall transfer level: Needs assistance Equipment used: Rolling walker (2 wheels) Transfers: Sit to/from Stand Sit to Stand: Supervision           General transfer comment: cued for hand placement with RW    Ambulation/Gait Ambulation/Gait assistance: Supervision Gait Distance (Feet): 30 Feet Assistive device: Rolling walker (2 wheels)         General Gait Details: pt with excellent adherence to [facility] status  Stairs            Wheelchair Mobility    Modified Rankin (Stroke Patients Only)       Balance Overall balance assessment: Needs assistance Sitting-balance support: Feet supported Sitting balance-Leahy Scale: Good       Standing balance-Leahy Scale: Fair                               Pertinent Vitals/Pain Pain Assessment: 0-10 Pain Score: 5  Pain Location: L hip Pain Descriptors / Indicators: Aching;Sore Pain Intervention(s): Limited activity within patient's tolerance;Monitored during session;Repositioned;Premedicated before session    Home Living  Prior Function                       Hand Dominance        Extremity/Trunk Assessment   Upper Extremity Assessment Upper Extremity Assessment: Overall WFL for tasks assessed    Lower Extremity Assessment Lower Extremity Assessment: Generalized weakness    Cervical / Trunk Assessment Cervical / Trunk Assessment: Normal  Communication      Cognition Arousal/Alertness:  Awake/alert Behavior During Therapy: WFL for tasks assessed/performed Overall Cognitive Status: Within Functional Limits for tasks assessed                                          General Comments      Exercises     Assessment/Plan    PT Assessment Patient needs continued PT services  PT Problem List Decreased strength;Decreased mobility;Decreased range of motion;Decreased activity tolerance;Decreased balance;Decreased knowledge of use of DME;Decreased knowledge of precautions       PT Treatment Interventions DME instruction;Therapeutic exercise;Gait training;Balance training;Neuromuscular re-education;Wheelchair mobility training;Stair training;Functional mobility training;Therapeutic activities;Patient/family education    PT Goals (Current goals can be found in the Care Plan section)  Acute Rehab PT Goals Patient Stated Goal: to go home PT Goal Formulation: With patient Time For Goal Achievement: 06/08/21 Potential to Achieve Goals: Good    Frequency 7X/week     Co-evaluation               AM-PAC PT "6 Clicks" Mobility  Outcome Measure Help needed turning from your back to your side while in a flat bed without using bedrails?: None Help needed moving from lying on your back to sitting on the side of a flat bed without using bedrails?: None Help needed moving to and from a bed to a chair (including a wheelchair)?: None Help needed standing up from a chair using your arms (e.g., wheelchair or bedside chair)?: A Little Help needed to walk in hospital room?: A Little Help needed climbing 3-5 steps with a railing? : A Lot 6 Click Score: 20    End of Session Equipment Utilized During Treatment: Gait belt Activity Tolerance: Patient tolerated treatment well Patient left: in chair;with call bell/phone within reach Nurse Communication: Mobility status PT Visit Diagnosis: Other abnormalities of gait and mobility (R26.89);Difficulty in walking, not  elsewhere classified (R26.2);Muscle weakness (generalized) (M62.81)    Time: [id number] PT Time Calculation (min) (ACUTE ONLY): 32 min   Charges:   PT Evaluation $PT Eval Low Complexity: 1 Low PT Treatments $Therapeutic Activity: 23-37 mins        Lieutenant Diego PT, DPT 12:39 PM,05/25/21

## 2021-05-25 NOTE — Discharge Summary (Signed)
Physician Discharge Summary  Patient ID: Tracy Herman MRN: [id number] DOB/AGE: 79/10/1940 79 y.o.  Admit date: 05/24/2021 Discharge date: 05/25/2021  Admission Diagnoses:  Tear of left gluteus medius tendon [S76.012A]  Discharge Diagnoses: Patient Active Problem List   Diagnosis Date Noted   Tear of left gluteus medius tendon 05/24/2021   Syncope 12/27/2020   Right fibular fracture 12/27/2020   Incomplete bladder emptying 10/22/2017   Menopause 10/22/2016   Status post hysterectomy 10/22/2016   Allergic rhinitis 01/17/2014   Benign essential hypertension 01/17/2014   Other and unspecified hyperlipidemia 01/17/2014    Past Medical History:  Diagnosis Date   AR (allergic rhinitis)    Bursitis    Cystocele    Depression    Hyperlipemia    Hypertension    Menopause    PKD (polycystic kidney disease)    Rectocele      Transfusion: None.   Consultants (if any):   Discharged Condition: Improved  Hospital Course: Tracy Herman is an 81 y.o. 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    Surgeries: Procedure(s): OPEN REPAIR OF CHRONIC LEFT GLUTEUS MEDIUS TENDON TEAR on 05/24/2021 Patient tolerated the surgery well. Taken to PACU where she was stabilized and then transferred to the orthopedic floor.  Started on Lovenox 40mg  q 24 hrs. Foot pumps applied bilaterally at 80 mm. Heels elevated on bed with rolled towels. No evidence of DVT. Negative Homan.  Physical therapy started on day #1 for gait training and transfer. OT started day #1 for ADL and assisted devices.  Patient was admitted overnight due to ongoing nausea, nausea had improved on POD1.  Patient's IV was removed on POD1.  Implants:   Three Smith and . Two Smight and Nephew Healicoil knotless Regenesorb anchors.  She was given perioperative  antibiotics:  Anti-infectives (From admission, onward)    Start     Dose/Rate Route Frequency Ordered Stop   05/25/21 0600  ceFAZolin (ANCEF) IVPB 2g/100 mL premix        2 g 200 mL/hr over 30 Minutes Intravenous On call to O.R. 05/24/21 1206 05/25/21 0710   05/25/21 0036  ceFAZolin (ANCEF) 2-4 GM/100ML-% IVPB       Note to Pharmacy: 07/23/21 P: cabinet override      05/25/21 0036 05/25/21 0045   05/24/21 1900  ceFAZolin (ANCEF) IVPB 2g/100 mL premix        2 g 200 mL/hr over 30 Minutes Intravenous Every 6 hours 05/24/21 1745 05/25/21 0735   05/24/21 1230  ceFAZolin (ANCEF) 2-4 GM/100ML-% IVPB       Note to Pharmacy: 07/22/21 L: cabinet override      05/24/21 1230 05/25/21 0115     .  She was given sequential compression devices, early ambulation, and Lovenox for DVT prophylaxis.  She benefited maximally from the hospital stay and there were no complications.    Recent vital signs:  Vitals:   05/25/21 0800 05/25/21 1200  BP: (!) 160/67 (!) 141/70  Pulse: 65 71  Resp: 16 18  Temp: (!) 97.4 F (36.3 C) 97.9 F (36.6 C)  SpO2: 96% 99%    Recent laboratory studies:  Lab Results  Component Value Date   HGB 12.6 12/27/2020   HGB 13.5 12/03/2012   HGB 11.5 (L) 11/26/2011   Lab Results  Component Value Date  WBC 9.1 12/27/2020   PLT 213 12/27/2020   No results found for: INR Lab Results  Component Value Date   NA 137 05/25/2021   K 3.9 05/25/2021   CL 104 05/25/2021   CO2 26 05/25/2021   BUN 12 05/25/2021   CREATININE 0.76 05/25/2021   GLUCOSE 79 05/25/2021    Discharge Medications:   Allergies as of 05/25/2021       Reactions   Codeine    Does not recall a reaction   Zestril [lisinopril]    depression   Latex Rash   Rash         Medication List     STOP taking these medications    senna 8.6 MG Tabs tablet Commonly known as: SENOKOT       TAKE these medications    acetaminophen 500 MG tablet Commonly known as: TYLENOL Take  500-1,000 mg by mouth every 6 (six) hours as needed for moderate pain or mild pain.   captopril 25 MG tablet Commonly known as: CAPOTEN Take 1 tablet (25 mg total) by mouth 2 (two) times daily.   cholecalciferol 1000 units tablet Commonly known as: VITAMIN D Take 1,000 Units by mouth daily.   conjugated estrogens vaginal cream Commonly known as: PREMARIN Place 1 applicator vaginally 2 (two) times a week.   Cyanocobalamin 3000 MCG Lozg Take 3,000 mcg by mouth daily.   HYDROcodone-acetaminophen 5-325 MG tablet Commonly known as: NORCO/VICODIN Take 1 tablet by mouth every 4 (four) hours as needed for severe pain or moderate pain.   hydrocortisone cream 1 % Apply 1 application topically daily as needed for itching.   psyllium 58.6 % powder Commonly known as: METAMUCIL Take 1 packet by mouth daily.   pyridOXINE 50 MG tablet Commonly known as: B-6 Take 50 mg by mouth daily.   Tums Ultra 1000 400 MG chewable tablet Generic drug: calcium elemental as carbonate Chew 1,000 mg by mouth daily.               Durable Medical Equipment  (From admission, onward)           Start     Ordered   05/24/21 1458  For home use only DME Walker rolling  Once       Question Answer Comment  Walker: With 5 Inch Wheels   Patient needs a walker to treat with the following condition Tear of left gluteus medius tendon      05/24/21 1457   05/24/21 1458  For home use only DME Other see comment  Once       Comments: 3-in-1 bedside commode  Question:  Length of Need  Answer:  Lifetime   05/24/21 1457           Diagnostic Studies: No results found.  Disposition: Plan for discharge home today pending progress with physical therapy.    Follow-up Information     Anson [place], PA-C Follow up in 14 day(s).   Specialty: Physician Assistant Why: Mindi Slicker information: [address] [place] [address] [phone]                 Signed: Meriel Pica PA-C 05/25/2021, 1:11 PM

## 2021-05-25 NOTE — Progress Notes (Signed)
°  Subjective: 1 Day Post-Op Procedure(s) (LRB): OPEN REPAIR OF CHRONIC LEFT GLUTEUS MEDIUS TENDON TEAR (Left) Patient reports pain as moderate.   Patient is well, nausea has improved since last night. Plan is to go Home after hospital stay. Negative for chest pain and shortness of breath Fever: no Gastrointestinal:Negative for nausea and vomiting Patient feels that she is passing a small amount of gas.  Objective: Vital signs in last 24 hours: Temp:  [97 F (36.1 C)-98.4 F (36.9 C)] 97 F (36.1 C) (01/06 0400) Pulse Rate:  [54-93] 54 (01/06 0400) Resp:  [10-18] 16 (01/06 0400) BP: (121-191)/(64-103) 144/67 (01/06 0400) SpO2:  [94 %-100 %] 99 % (01/06 0400) Weight:  [49.9 kg] 49.9 kg (01/05 1227)  Intake/Output from previous day:  Intake/Output Summary (Last 24 hours) at 05/25/2021 0751 Last data filed at 05/25/2021 0300 Gross per 24 hour  Intake 2118.75 ml  Output 625 ml  Net 1493.75 ml    Intake/Output this shift: No intake/output data recorded.  Labs: No results for input(s): HGB in the last 72 hours. No results for input(s): WBC, RBC, HCT, PLT in the last 72 hours. Recent Labs    05/25/21 0420  NA 137  K 3.9  CL 104  CO2 26  BUN 12  CREATININE 0.76  GLUCOSE 79  CALCIUM 8.4*   No results for input(s): LABPT, INR in the last 72 hours.   EXAM General - Patient is Alert, Appropriate, and Oriented Extremity - ABD soft Neurovascular intact Dorsiflexion/Plantar flexion intact Incision: scant drainage No cellulitis present Dressing/Incision - Mild blood tinged drainage to the left hip. Motor Function - intact, moving foot and toes well on exam.  Brace in intact to the left hip.  Past Medical History:  Diagnosis Date   AR (allergic rhinitis)    Bursitis    Cystocele    Depression    Hyperlipemia    Hypertension    Menopause    PKD (polycystic kidney disease)    Rectocele    Assessment/Plan: 1 Day Post-Op Procedure(s) (LRB): OPEN REPAIR OF CHRONIC  LEFT GLUTEUS MEDIUS TENDON TEAR (Left) Principal Problem:   Tear of left gluteus medius tendon  Estimated body mass index is 19.49 kg/m as calculated from the following:   Height as of this encounter: 5\' 3"  (1.6 m).   Weight as of this encounter: 49.9 kg. Advance diet Up with therapy  Vitals and labs reviewed. Nausea has significantly improved since last night. Up with therapy today. Brace intact, limit hip flexion to 70 degrees. Toe-touch weightbearing to the left leg. Will hopefully discharge home today depending on progress with therapy.  DVT Prophylaxis - Lovenox Toe-touch weightbearing to the left leg.  , PA-C [facility] 05/25/2021, 7:51 AM

## 2021-05-25 NOTE — Progress Notes (Signed)
Nsg Discharge Note  Admit Date:  05/24/2021 Discharge date: 05/25/2021   Burman Foster to be D/C'd Home per MD order.  AVS completed.  Patient/caregiver able to verbalize understanding.  Discharge Medication: Allergies as of 05/25/2021       Reactions   Codeine    Does not recall a reaction   Zestril [lisinopril]    depression   Latex Rash   Rash         Medication List     STOP taking these medications    senna 8.6 MG Tabs tablet Commonly known as: SENOKOT       TAKE these medications    acetaminophen 500 MG tablet Commonly known as: TYLENOL Take 500-1,000 mg by mouth every 6 (six) hours as needed for moderate pain or mild pain.   captopril 25 MG tablet Commonly known as: CAPOTEN Take 1 tablet (25 mg total) by mouth 2 (two) times daily.   cholecalciferol 1000 units tablet Commonly known as: VITAMIN D Take 1,000 Units by mouth daily.   conjugated estrogens vaginal cream Commonly known as: PREMARIN Place 1 applicator vaginally 2 (two) times a week.   Cyanocobalamin 3000 MCG Lozg Take 3,000 mcg by mouth daily.   HYDROcodone-acetaminophen 5-325 MG tablet Commonly known as: NORCO/VICODIN Take 1 tablet by mouth every 4 (four) hours as needed for severe pain or moderate pain.   hydrocortisone cream 1 % Apply 1 application topically daily as needed for itching.   psyllium 58.6 % powder Commonly known as: METAMUCIL Take 1 packet by mouth daily.   pyridOXINE 50 MG tablet Commonly known as: B-6 Take 50 mg by mouth daily.   Tums Ultra 1000 400 MG chewable tablet Generic drug: calcium elemental as carbonate Chew 1,000 mg by mouth daily.               Durable Medical Equipment  (From admission, onward)           Start     Ordered   05/24/21 1458  For home use only DME Walker rolling  Once       Question Answer Comment  Walker: With 5 Inch Wheels   Patient needs a walker to treat with the following condition Tear of left gluteus medius tendon       05/24/21 1457   05/24/21 1458  For home use only DME Other see comment  Once       Comments: 3-in-1 bedside commode  Question:  Length of Need  Answer:  Lifetime   05/24/21 1457            Discharge Assessment: Vitals:   05/25/21 0800 05/25/21 1200  BP: (!) 160/67 (!) 141/70  Pulse: 65 71  Resp: 16 18  Temp: (!) 97.4 F (36.3 C) 97.9 F (36.6 C)  SpO2: 96% 99%   Skin clean, dry and intact without evidence of skin break down, no evidence of skin tears noted. IV catheter discontinued intact. Site without signs and symptoms of complications - no redness or edema noted at insertion site, patient denies c/o pain - only slight tenderness at site.  Dressing with slight pressure applied.  D/c Instructions-Education: Discharge instructions given to patient/family with verbalized understanding. D/c education completed with patient/family including follow up instructions, medication list, d/c activities limitations if indicated, with other d/c instructions as indicated by MD - patient able to verbalize understanding, all questions fully answered. Patient instructed to return to ED, call 911, or call MD for any changes in condition.  Patient escorted via WC, and D/C home via private auto.  Theodore Demark, RN 05/25/2021 1:33 PM

## 2021-05-25 NOTE — TOC Progression Note (Signed)
Transition of Care [facility]) - Progression Note    Patient Details  Name: Tracy Herman MRN: [id number] Date of Birth: [birth year]  Transition [facility] Care [facility]) CM/SW [place], RN Phone Number: 05/25/2021, 10:23 AM  Clinical Narrative:   Reached out to [place] with [facility] to see if they are accepting the patient for [facility] services, The patient lives at home with husband  Rush Landmark and has a RW and a 3 in 1 and a wheelchair, the husband is going to put up a temporary ramp, Transportation provided by family, PCP Dr Baldemar Lenis, She gets her meds at [facility],  [place] will accept her and will see her on Monday         Expected Discharge Plan and Services           Expected Discharge Date: 05/24/21                                     Social Determinants of Health (SDOH) Interventions    Readmission Risk Interventions No flowsheet data found.

## 2021-05-25 NOTE — Evaluation (Addendum)
Occupational Therapy Evaluation °Patient Details °Name: Tracy Herman °MRN: [id number] °DOB: 08/06/1940 °Today's Date: 05/25/2021 ° ° °History of Present Illness Pt is a 80 s/p gluteus medius repair. PMH of R ankle fx, HTN, HLD, polycystic kidney disease, breast sx.  ° °Clinical Impression °  °Pt seen for OT evaluation this date in setting of acute hospitalization s/p glut med repair. She reports being MOD I at baseline with use of RW for fxl mobility since an ankle fx for which she received HHPT. She presents this date with mild pain in L LE as well as c/o discomfort from brace and slightly decreased fxl activity tolerance. Pt currently requires:  MOD A for LB ADLs d/t restrictions 2/2 brace, CGA/SUPV for ADL transfers with RW, SETUP to INDEP for seated UB ADLs. She is returned to bed end of session with all needs met and in reach. She reports good support at home from spouse who is physically able. Will continue to follow acutely. Recommend f/u HHOT.  °   ° °Recommendations for follow up therapy are one component of a multi-disciplinary discharge planning process, led by the attending physician.  Recommendations may be updated based on patient status, additional functional criteria and insurance authorization.  ° °Follow Up Recommendations ° Home health OT  °  °Assistance Recommended at Discharge Intermittent Supervision/Assistance  °Patient can return home with the following A little help with walking and/or transfers;A lot of help with bathing/dressing/bathroom;Assistance with cooking/housework;Assist for transportation ° °  °Functional Status Assessment ° Patient has had a recent decline in their functional status and demonstrates the ability to make significant improvements in function in a reasonable and predictable amount of time.  °Equipment Recommendations ° None recommended by OT  °  °Recommendations for Other Services   ° ° °  °Precautions / Restrictions Precautions °Precautions: Fall °Required Braces or  Orthoses: Other Brace °Other Brace: abduction brace °Restrictions °Weight Bearing Restrictions: Yes °LLE Weight Bearing: Partial weight bearing °LLE Partial Weight Bearing Percentage or Pounds: 25%  ° °  ° °Mobility Bed Mobility °Overal bed mobility: Needs Assistance °Bed Mobility: Supine to Sit;Sit to Supine °  °  °Supine to sit: Supervision;HOB elevated °Sit to supine: Supervision °  °  °  ° °Transfers °Overall transfer level: Needs assistance °Equipment used: Rolling walker (2 wheels) °Transfers: Sit to/from Stand °Sit to Stand: Supervision °  °  °  °  °  °General transfer comment: cue for safe hand placement/sequencing for RW use. °  ° °  °Balance Overall balance assessment: Needs assistance °Sitting-balance support: Feet supported °Sitting balance-Leahy Scale: Good °  °  °Standing balance support: Reliant on assistive device for balance °Standing balance-Leahy Scale: Fair °  °  °  °  °  °  °  °  °  °  °  °  °   ° °ADL either performed or assessed with clinical judgement  ° °ADL   °  °  °  °  °  °  °  °  °  °  °  °  °  °  °  °  °  °  °  °General ADL Comments: requires MOD A for LB ADLs d/t restrictions 2/2 brace, CGA/SUPV for ADL transfers with RW, SETUP to INDEP for seated UB ADLs.  ° ° ° °Vision Baseline Vision/History: 1 Wears glasses °Patient Visual Report: No change from baseline °   °   °Perception   °  °Praxis   °  ° °  Pertinent Vitals/Pain Pain Assessment: 0-10 Pain Score: 4  Pain Location: L hip Pain Descriptors / Indicators: Aching;Sore Pain Intervention(s): Limited activity within patient's tolerance;Monitored during session;Repositioned     Hand Dominance     Extremity/Trunk Assessment Upper Extremity Assessment Upper Extremity Assessment: Overall WFL for tasks assessed   Lower Extremity Assessment Lower Extremity Assessment: Defer to PT evaluation;Generalized weakness   Cervical / Trunk Assessment Cervical / Trunk Assessment: Normal   Communication Communication Communication: No  difficulties   Cognition Arousal/Alertness: Awake/alert Behavior During Therapy: WFL for tasks assessed/performed Overall Cognitive Status: Within Functional Limits for tasks assessed                                       General Comments       Exercises Other Exercises Other Exercises: OT ed re: LB ADL modification   Shoulder Instructions      Home Living Family/patient expects to be discharged to:: Private residence Living Arrangements: Spouse/significant other Available Help at Discharge: Family;Available 24 hours/day Type of Home: House Home Access: Stairs to enter [employer] of Steps: 3 STE no railing (pt's husband was planning on building pt a ramp for next weeks surgery)   Home Layout: One level               Home Equipment: Conservation officer, nature (2 wheels);BSC/3in1;Tub bench          Prior Functioning/Environment Prior Level of Function : History of Falls (last six months)             Mobility Comments: was waling with walker since fall and ankle fx on R side that she recently healed from (working wtih HHPT) ADLs Comments: MOD I for bathing using tub bench        OT Problem List: Decreased strength;Decreased range of motion;Decreased activity tolerance;Decreased knowledge of use of DME or AE      OT Treatment/Interventions: Self-care/ADL training;Therapeutic exercise;DME and/or AE instruction;Therapeutic activities;Patient/family education;Balance training    OT Goals(Current goals can be found in the care plan section) Acute Rehab OT Goals Patient Stated Goal: go home OT Goal Formulation: With patient Time For Goal Achievement: 06/08/21 Potential to Achieve Goals: Good ADL Goals Pt Will Perform Lower Body Dressing: with modified independence;with adaptive equipment Pt Will Transfer to Toilet: with modified independence  OT Frequency: Min 2X/week    Co-evaluation              AM-PAC OT "6 Clicks" Daily Activity      Outcome Measure Help from another person eating meals?: None Help from another person taking care of personal grooming?: A Little Help from another person toileting, which includes using toliet, bedpan, or urinal?: A Little Help from another person bathing (including washing, rinsing, drying)?: A Lot Help from another person to put on and taking off regular upper body clothing?: None Help from another person to put on and taking off regular lower body clothing?: A Lot 6 Click Score: 18   End of Session Equipment Utilized During Treatment: Rolling walker (2 wheels) Nurse Communication: Mobility status  Activity Tolerance: Patient tolerated treatment well Patient left: in bed;with call bell/phone within reach  OT Visit Diagnosis: Muscle weakness (generalized) (M62.81);Other abnormalities of gait and mobility (R26.89)                Time: 5427-0623 OT Time Calculation (min): 26 min Charges:  OT General Charges $OT Visit:  1 Visit °OT Evaluation °$OT Eval Low Complexity: 1 Low °OT Treatments °$Self Care/Home Management : 8-22 mins ° °Alison Sell, MS, OTR/L °ascom [phone] °05/25/21, 2:23 PM  °

## 2021-06-20 ENCOUNTER — Encounter: Payer: Medicare PPO | Admitting: Obstetrics and Gynecology

## 2022-01-03 ENCOUNTER — Emergency Department: Payer: Medicare PPO

## 2022-01-03 ENCOUNTER — Other Ambulatory Visit: Payer: Self-pay

## 2022-01-03 ENCOUNTER — Emergency Department
Admission: EM | Admit: 2022-01-03 | Discharge: 2022-01-03 | Disposition: A | Discharge status: Home / Self Care | Payer: Medicare PPO | Attending: Emergency Medicine | Admitting: Emergency Medicine

## 2022-01-03 ENCOUNTER — Encounter: Payer: Self-pay | Admitting: Emergency Medicine

## 2022-01-03 DIAGNOSIS — M25551 Pain in right hip: Secondary | ICD-10-CM | POA: Diagnosis not present

## 2022-01-03 DIAGNOSIS — M25552 Pain in left hip: Secondary | ICD-10-CM | POA: Insufficient documentation

## 2022-01-03 DIAGNOSIS — W19XXXA Unspecified fall, initial encounter: Secondary | ICD-10-CM

## 2022-01-03 DIAGNOSIS — I10 Essential (primary) hypertension: Secondary | ICD-10-CM | POA: Insufficient documentation

## 2022-01-03 DIAGNOSIS — M542 Cervicalgia: Secondary | ICD-10-CM | POA: Insufficient documentation

## 2022-01-03 DIAGNOSIS — Y92000 Kitchen of unspecified non-institutional (private) residence as  the place of occurrence of the external cause: Secondary | ICD-10-CM | POA: Diagnosis not present

## 2022-01-03 DIAGNOSIS — S0181XA Laceration without foreign body of other part of head, initial encounter: Secondary | ICD-10-CM | POA: Diagnosis not present

## 2022-01-03 DIAGNOSIS — M25511 Pain in right shoulder: Secondary | ICD-10-CM | POA: Insufficient documentation

## 2022-01-03 DIAGNOSIS — S0993XA Unspecified injury of face, initial encounter: Secondary | ICD-10-CM | POA: Diagnosis present

## 2022-01-03 MED ORDER — HYDROCODONE-ACETAMINOPHEN 5-325 MG PO TABS: 1.0000 | ORAL_TABLET | Freq: Four times a day (QID) | ORAL | 0 refills | Status: AC | PRN

## 2022-01-03 MED ORDER — ONDANSETRON 4 MG PO TBDP: 4.0000 mg | ORAL_TABLET | Freq: Three times a day (TID) | ORAL | 0 refills | Status: AC | PRN

## 2022-01-03 NOTE — ED Triage Notes (Signed)
Pt. To ED for lac to right forehead from fall Tuesday night. Pt. States she tripped over her wheelchair and fell with her head into corner of kitchen counter. Pt. Denies LOC, states right shoulder, arm and whole side of body are sore and aching. Denies vomiting, dizziness or visual disturbances.

## 2022-01-03 NOTE — ED Notes (Signed)
Pt husband at bedside

## 2022-01-03 NOTE — ED Provider Notes (Signed)
[facility] Provider Note  Patient Contact: 3:12 PM (approximate)   History   Fall (Pt. To ED for lac to right forehead from fall Tuesday night. Pt. States she tripped over her wheelchair and fell with her head into corner of kitchen counter. Pt. Denies LOC, states right shoulder, arm and whole side of body are sore and aching. Denies vomiting, dizziness or visual disturbances.)   HPI  Tracy Herman is a 81 y.o. female with a history of hypertension, hyperlipidemia and polycystic kidney disease, presents to the emergency department after patient had a mechanical fall on Tuesday resulting in patient hitting the right aspect of her forehead.  She denies loss of consciousness but is having some neck pain.  No numbness or tingling in the upper and lower extremities.  She is complaining of right shoulder pain and bilateral hip pain.  She has been able to stand and walk for short distances but has been using a wheelchair more over the past 48 hours than what she normally does.  She typically walks assisted with a walker and sometimes uses a wheelchair when she is out and about.  She denies chest pain or abdominal pain.  Denies use of anticoagulation.      Physical Exam   Triage Vital Signs: ED Triage Vitals  Enc Vitals Group     BP 01/03/22 1422 (!) 156/88     Pulse Rate 01/03/22 1422 68     Resp 01/03/22 1422 18     Temp --      Temp src --      SpO2 01/03/22 1422 97 %     Weight 01/03/22 1424 110 lb (49.9 kg)     Height 01/03/22 1424 5\' 4"  (1.626 m)     Head Circumference --      Peak Flow --      Pain Score 01/03/22 1423 4     Pain Loc --      Pain Edu? --      Excl. in GC? --     Most recent vital signs: Vitals:   01/03/22 1422  BP: (!) 156/88  Pulse: 68  Resp: 18  SpO2: 97%     General: Alert and in no acute distress.  Patient appears comfortable. Eyes:  PERRL. EOMI. Head: No acute traumatic findings.  Patient has a 0.5 cm x 0.5 cm laceration  with overlying scab along the right aspect of the forehead.  No step-off deformities appreciated.  No scalp hematomas. ENT:      Nose: No congestion/rhinnorhea.      Mouth/Throat: Mucous membranes are moist. Neck: No stridor.  Patient has midline cervical spine tenderness to palpation.  Cardiovascular:  Good peripheral perfusion Respiratory: Normal respiratory effort without tachypnea or retractions. Lungs CTAB. Good air entry to the bases with no decreased or absent breath sounds. Gastrointestinal: Bowel sounds 4 quadrants. Soft and nontender to palpation. No guarding or rigidity. No palpable masses. No distention. No CVA tenderness. Musculoskeletal: Full range of motion to all extremities.  Neurologic:  No gross focal neurologic deficits are appreciated.  Skin:   No rash noted    ED Results / Procedures / Treatments   Labs (all labs ordered are listed, but only abnormal results are displayed) Labs Reviewed - No data to display     RADIOLOGY  I personally viewed and evaluated these images as part of my medical decision making, as well as reviewing the written report by the radiologist.  ED  Provider Interpretation: No acute abnormality on CTs of the head, cervical spine and face.  X-rays of the bilateral hips unremarkable.  No acute bony abnormality on x-ray of the right shoulder.  No evidence of pneumothorax on chest x-ray.   PROCEDURES:  Critical Care performed: No  Procedures   MEDICATIONS ORDERED IN ED: Medications - No data to display   IMPRESSION / MDM / ASSESSMENT AND PLAN / ED COURSE  I reviewed the triage vital signs and the nursing notes.                              Assessment and plan Fall 81 year old female with past medical history detailed above presents to the emergency department after patient had a mechanical fall.  She is accompanied by caretaker.  Patient was hypertensive at triage but vital signs otherwise reassuring.  She was alert and able  to provide her own historical details.  She complained of right shoulder pain and right elbow pain but had full range of motion of these joints and symmetric strength in the upper extremities.  She did express concern about having a surgery on her left hip recently and has been using wheelchair more often.  We will obtain CTs of the head, face and cervical spine.  Will obtain chest x-ray and x-rays of the bilateral hips and right shoulder and will reassess.  Patient is outside the window for laceration repair.  CTs of the head, face and cervical spine show no acute abnormalities.  X-ray of the right shoulder does not show any acute bony abnormality.  X-rays of the bilateral hips unremarkable.  No evidence of pneumothorax on chest x-ray.  Patient was prescribed a short course of Norco for pain.  Tetanus status up-to-date.  Return precautions were given to return with new or worsening symptoms.     FINAL CLINICAL IMPRESSION(S) / ED DIAGNOSES   Final diagnoses:  Fall, initial encounter     Rx / DC Orders   ED Discharge Orders          Ordered    HYDROcodone-acetaminophen (NORCO) 5-325 MG tablet  Every 6 hours PRN        01/03/22 1618    ondansetron (ZOFRAN-ODT) 4 MG disintegrating tablet  Every 8 hours PRN        01/03/22 1618             Note:  This document was prepared using Dragon voice recognition software and may include unintentional dictation errors.   Pia Mau [place], [place] 01/03/22 1642    Minna Antis, MD 01/03/22 1840

## 2022-01-03 NOTE — ED Notes (Signed)
Pt reports that she fell on Tuesday night, landing on the right shoulder and elbow and hitting her right forehead. Pt has small laceration on the upper right forehead that is starting to scab over, there is no bleeding noted at this time. Pt denies taking blood thinners, Pt denies LOC. Pt reports that her feet got tangled up in her wheelchair.  Pt states that she is having pain in her right shoulder and elbow. Pt has good ROM and is able to move all extremities.

## 2022-03-20 ENCOUNTER — Encounter: Payer: Self-pay | Admitting: Ophthalmology

## 2022-03-21 NOTE — Discharge Instructions (Signed)

## 2022-03-26 ENCOUNTER — Encounter
Admission: RE | Disposition: A | Discharge status: Home / Self Care | Payer: Self-pay | Source: Home / Self Care | Attending: Ophthalmology

## 2022-03-26 ENCOUNTER — Ambulatory Visit: Payer: Medicare PPO | Admitting: Anesthesiology

## 2022-03-26 ENCOUNTER — Ambulatory Visit
Admission: RE | Admit: 2022-03-26 | Discharge: 2022-03-26 | Disposition: A | Discharge status: Home / Self Care | Payer: Medicare PPO | Attending: Ophthalmology | Admitting: Ophthalmology

## 2022-03-26 ENCOUNTER — Encounter: Payer: Self-pay | Admitting: Ophthalmology

## 2022-03-26 ENCOUNTER — Other Ambulatory Visit: Payer: Self-pay

## 2022-03-26 DIAGNOSIS — I10 Essential (primary) hypertension: Secondary | ICD-10-CM | POA: Insufficient documentation

## 2022-03-26 DIAGNOSIS — Z87891 Personal history of nicotine dependence: Secondary | ICD-10-CM | POA: Diagnosis not present

## 2022-03-26 DIAGNOSIS — H2511 Age-related nuclear cataract, right eye: Secondary | ICD-10-CM | POA: Insufficient documentation

## 2022-03-26 HISTORY — DX: Dependence on wheelchair: Z99.3

## 2022-03-26 HISTORY — DX: Small intestinal bacterial overgrowth, unspecified: K63.8219

## 2022-03-26 HISTORY — PX: CATARACT EXTRACTION W/PHACO: SHX586

## 2022-03-26 SURGERY — PHACOEMULSIFICATION, CATARACT, WITH IOL INSERTION
Anesthesia: Monitor Anesthesia Care | Site: Eye | Laterality: Right

## 2022-03-26 MED ORDER — TETRACAINE HCL 0.5 % OP SOLN
1.0000 [drp] | OPHTHALMIC | Status: DC | PRN
  Administered 2022-03-26 (×3): 1 [drp] via OPHTHALMIC

## 2022-03-26 MED ORDER — SIGHTPATH DOSE#1 BSS IO SOLN
INTRAOCULAR | Status: DC | PRN
  Administered 2022-03-26: 15 mL via INTRAOCULAR

## 2022-03-26 MED ORDER — SIGHTPATH DOSE#1 NA CHONDROIT SULF-NA HYALURON 40-17 MG/ML IO SOLN
INTRAOCULAR | Status: DC | PRN
  Administered 2022-03-26: 1 mL via INTRAOCULAR

## 2022-03-26 MED ORDER — MOXIFLOXACIN HCL 0.5 % OP SOLN
OPHTHALMIC | Status: DC | PRN
  Administered 2022-03-26: 0.2 mL via OPHTHALMIC

## 2022-03-26 MED ORDER — SIGHTPATH DOSE#1 BSS IO SOLN
INTRAOCULAR | Status: DC | PRN
  Administered 2022-03-26: 74 mL via OPHTHALMIC

## 2022-03-26 MED ORDER — FENTANYL CITRATE (PF) 100 MCG/2ML IJ SOLN
INTRAMUSCULAR | Status: DC | PRN
  Administered 2022-03-26: 25 ug via INTRAVENOUS

## 2022-03-26 MED ORDER — ARMC OPHTHALMIC DILATING DROPS
1.0000 | OPHTHALMIC | Status: DC | PRN
  Administered 2022-03-26 (×3): 1 via OPHTHALMIC

## 2022-03-26 MED ORDER — BRIMONIDINE TARTRATE-TIMOLOL 0.2-0.5 % OP SOLN
OPHTHALMIC | Status: DC | PRN
  Administered 2022-03-26: 1 [drp] via OPHTHALMIC

## 2022-03-26 MED ORDER — SIGHTPATH DOSE#1 BSS IO SOLN
INTRAOCULAR | Status: DC | PRN
  Administered 2022-03-26: 2 mL

## 2022-03-26 MED ORDER — MIDAZOLAM HCL 2 MG/2ML IJ SOLN
INTRAMUSCULAR | Status: DC | PRN
  Administered 2022-03-26: 1 mg via INTRAVENOUS

## 2022-03-26 SURGICAL SUPPLY — 11 items
CANNULA ANT/CHMB 27G (MISCELLANEOUS) IMPLANT
CANNULA ANT/CHMB 27GA (MISCELLANEOUS) IMPLANT
CATARACT SUITE SIGHTPATH (MISCELLANEOUS) ×1 IMPLANT
FEE CATARACT SUITE SIGHTPATH (MISCELLANEOUS) ×1 IMPLANT
GLOVE SURG ENC TEXT LTX SZ8 (GLOVE) ×1 IMPLANT
GLOVE SURG TRIUMPH 8.0 PF LTX (GLOVE) ×1 IMPLANT
LENS IOL TECNIS EYHANCE 19.0 (Intraocular Lens) IMPLANT
NDL FILTER BLUNT 18X1 1/2 (NEEDLE) ×1 IMPLANT
NEEDLE FILTER BLUNT 18X1 1/2 (NEEDLE) ×1 IMPLANT
SYR 3ML LL SCALE MARK (SYRINGE) ×1 IMPLANT
WATER STERILE IRR 250ML POUR (IV SOLUTION) ×1 IMPLANT

## 2022-03-26 NOTE — Anesthesia Preprocedure Evaluation (Addendum)
Anesthesia Evaluation  Patient identified by MRN, date of birth, ID band Patient awake    Reviewed: Allergy & Precautions, NPO status , Patient's Chart, lab work & pertinent test results  History of Anesthesia Complications Negative for: history of anesthetic complications  Airway Mallampati: II  TM Distance: >3 FB Neck ROM: Full    Dental  (+) Chipped   Pulmonary neg sleep apnea, neg COPD, Patient abstained from smoking.Not current smoker, former smoker   Pulmonary exam normal breath sounds clear to auscultation       Cardiovascular Exercise Tolerance: Poor METShypertension, Pt. on medications (-) CAD and (-) Past MI (-) dysrhythmias  Rhythm:Regular Rate:Normal - Systolic murmurs    Neuro/Psych  PSYCHIATRIC DISORDERS  Depression    negative neurological ROS     GI/Hepatic ,neg GERD  ,,(+)     (-) substance abuse    Endo/Other  neg diabetes    Renal/GU Renal disease     Musculoskeletal   Abdominal   Peds  Hematology   Anesthesia Other Findings Past Medical History: No date: AR (allergic rhinitis) No date: Bursitis No date: Cystocele No date: Depression No date: Hyperlipemia No date: Hypertension No date: Menopause No date: PKD (polycystic kidney disease) No date: Rectocele  Reproductive/Obstetrics                             Anesthesia Physical Anesthesia Plan  ASA: 2  Anesthesia Plan: MAC   Post-op Pain Management:    Induction: Intravenous  PONV Risk Score and Plan: 3  Airway Management Planned: Natural Airway  Additional Equipment: None  Intra-op Plan:   Post-operative Plan:   Informed Consent: I have reviewed the patients History and Physical, chart, labs and discussed the procedure including the risks, benefits and alternatives for the proposed anesthesia with the patient or authorized representative who has indicated his/her understanding and acceptance.        Plan Discussed with: CRNA and Surgeon  Anesthesia Plan Comments:         Anesthesia Quick Evaluation

## 2022-03-26 NOTE — Op Note (Signed)
PREOPERATIVE DIAGNOSIS:  Nuclear sclerotic cataract of the right eye.   POSTOPERATIVE DIAGNOSIS:  Cataract   OPERATIVE PROCEDURE:ORPROCALL@   SURGEON:  Tracy Robson, MD.   ANESTHESIA:  Anesthesiologist: Iran Ouch, MD CRNA: Tobie Poet, CRNA  1.      Managed anesthesia care. 2.      0.74ml of Shugarcaine was instilled in the eye following the paracentesis.   COMPLICATIONS:  None.   TECHNIQUE:   Stop and chop   DESCRIPTION OF PROCEDURE:  The patient was examined and consented in the preoperative holding area where the aforementioned topical anesthesia was applied to the right eye and then brought back to the Operating Room where the right eye was prepped and draped in the usual sterile ophthalmic fashion and a lid speculum was placed. A paracentesis was created with the side port blade and the anterior chamber was filled with viscoelastic. A near clear corneal incision was performed with the steel keratome. A continuous curvilinear capsulorrhexis was performed with a cystotome followed by the capsulorrhexis forceps. Hydrodissection and hydrodelineation were carried out with BSS on a blunt cannula. The lens was removed in a stop and chop  technique and the remaining cortical material was removed with the irrigation-aspiration handpiece. The capsular bag was inflated with viscoelastic and the Technis [id number]  lens was placed in the capsular bag without complication. The remaining viscoelastic was removed from the eye with the irrigation-aspiration handpiece. The wounds were hydrated. The anterior chamber was flushed with BSS and the eye was inflated to physiologic pressure. 0.36ml of Vigamox was placed in the anterior chamber. The wounds were found to be water tight. The eye was dressed with Combigan. The patient was given protective glasses to wear throughout the day and a shield with which to sleep tonight. The patient was also given drops with which to begin a drop regimen today  and will follow-up with me in one day. Implant Name Type Inv. Item Serial No. Manufacturer Lot No. LRB No. Used Action  LENS IOL TECNIS EYHANCE 19.0 - [id number] Intraocular Lens LENS IOL TECNIS EYHANCE 19.0 [id number] [employer]  Right 1 Implanted   Procedure(s): CATARACT EXTRACTION PHACO AND INTRAOCULAR LENS PLACEMENT (IOC) RIGHT 15.49 01:12.2 (Right)  Electronically signed: Birder Herman 03/26/2022 11:15 AM

## 2022-03-26 NOTE — H&P (Signed)
[place]   Primary Care Physician:  Derinda Late, MD Ophthalmologist: Dr. George Ina  Pre-Procedure History & Physical: HPI:  Tracy Herman is a 81 y.o. female here for cataract surgery.   Past Medical History:  Diagnosis Date   AR (allergic rhinitis)    Bursitis    Cystocele    Depression    Hyperlipemia    Hypertension    Menopause    PKD (polycystic kidney disease)    Rectocele    Small intestinal bacterial overgrowth (SIBO)    Uses wheelchair    Able to stand and bear weight    Past Surgical History:  Procedure Laterality Date   ABDOMINAL HYSTERECTOMY  1975   TAH- MENORRHAGIA - FIBROIDS   ANTERIOR AND POSTERIOR VAGINAL REPAIR     WITH ENTEROCELE LIGATION   APPENDECTOMY  1975   BLADDER SURGERY     BREAST SURGERY     BILATERAL BREAST REDUCTION   LAPAROTOMY     W.PARTIAL BOWEL RESECTION D/T BOWEL NECROSIS TO ADHESIONS   QUADRICEPS TENDON REPAIR Left 05/24/2021   Procedure: OPEN REPAIR OF CHRONIC LEFT GLUTEUS MEDIUS TENDON TEAR;  Surgeon: Corky Mull, MD;  Location: [facility] ORS;  Service: Orthopedics;  Laterality: Left;   REDUCTION MAMMAPLASTY Bilateral 1992    Prior to Admission medications   Medication Sig Start Date End Date Taking? Authorizing Provider  acetaminophen (TYLENOL) 500 MG tablet Take 500-1,000 mg by mouth every 6 (six) hours as needed for moderate pain or mild pain.   Yes [provider]  calcium elemental as carbonate (TUMS ULTRA 1000) 400 MG chewable tablet Chew 1,000 mg by mouth daily.   Yes [provider]  captopril (CAPOTEN) 25 MG tablet Take 1 tablet (25 mg total) by mouth 2 (two) times daily. 12/29/20 03/26/22 Yes Sreenath, Sudheer B, MD  cholecalciferol (VITAMIN D) 1000 units tablet Take 1,000 Units by mouth daily.   Yes [provider]  Cyanocobalamin 3000 MCG LOZG Take 3,000 mcg by mouth daily.   Yes [provider]  psyllium (METAMUCIL) 58.6 % powder Take 1 packet by mouth daily.   Yes [provider]  pyridOXINE (B-6) 50 MG tablet Take 50 mg by mouth daily.   Yes [provider]  conjugated estrogens (PREMARIN) vaginal cream Place 1 applicator vaginally 2 (two) times a week. Patient not taking: Reported on 03/26/2022    [provider]  hydrocortisone cream 1 % Apply 1 application topically daily as needed for itching. Patient not taking: Reported on 03/26/2022    [provider]    Allergies as of 01/28/2022 - Review Complete 01/03/2022  Allergen Reaction Noted   Codeine  08/14/2015   Zestril [lisinopril]  08/14/2015   Latex Rash 05/18/2021    Family History  Problem Relation Age of Onset   Polycystic kidney disease Mother    Polycystic kidney disease Sister    Cancer Neg Hx    Diabetes Neg Hx    Heart disease Neg Hx     Social History   Socioeconomic History   Marital status: Married    Spouse name: Not on file   Number of children: Not on file   Years of education: Not on file   Highest education level: Not on file  Occupational History   Not on file  Tobacco Use   Smoking status: Former    Types: Cigarettes    Quit date: 1977    Years since quitting: 46.8   Smokeless tobacco: Never  Vaping Use   Vaping Use: Never used  Substance and Sexual Activity   Alcohol use: Yes    Alcohol/week: 7.0 standard drinks of alcohol    Types: 7 Glasses of wine per week    Comment: RED WINE QD   Drug use: No   Sexual activity: Not Currently    Birth control/protection: Surgical  Other Topics Concern   Not on file  Social History Narrative   Lives with hubby .   Social Determinants of Health   Financial Resource Strain: Not on file  Food Insecurity: Not on file  Transportation Needs: Not on file  Physical Activity: Sufficiently Active (10/22/2017)   Exercise Vital Sign    Days of Exercise per Week: 5 days    Minutes of Exercise per Session: 30 min  Stress: Not on file  Social Connections: Not on file  Intimate Partner  Violence: Not on file    Review of Systems: See HPI, otherwise negative ROS  Physical Exam: BP (!) 164/103   Pulse 84   Temp 98.8 F (37.1 C) (Oral)   Resp 16   Ht 5\' 4"  (1.626 m)   Wt 47.4 kg   SpO2 98%   BMI 17.94 kg/m  General:   Alert, cooperative in NAD Head:  Normocephalic and atraumatic. Respiratory:  Normal work of breathing. Cardiovascular:  RRR  Impression/Plan: Tracy Herman is here for cataract surgery.  Risks, benefits, limitations, and alternatives regarding cataract surgery have been reviewed with the patient.  Questions have been answered.  All parties agreeable.   Birder Robson, MD  03/26/2022, 10:48 AM

## 2022-03-26 NOTE — Anesthesia Postprocedure Evaluation (Signed)
Anesthesia Post Note  Patient: Tracy Herman  Procedure(s) Performed: CATARACT EXTRACTION PHACO AND INTRAOCULAR LENS PLACEMENT (IOC) RIGHT 15.49 01:12.2 (Right: Eye)  Patient location during evaluation: PACU Anesthesia Type: MAC Level of consciousness: awake and alert Pain management: pain level controlled Vital Signs Assessment: post-procedure vital signs reviewed and stable Respiratory status: spontaneous breathing, nonlabored ventilation and respiratory function stable Cardiovascular status: blood pressure returned to baseline and stable Postop Assessment: no apparent nausea or vomiting Anesthetic complications: no   No notable events documented.   Last Vitals:  Vitals:   03/26/22 1023 03/26/22 1116  BP: (!) 164/103 (!) 152/81  Pulse: 84   Resp: 16 18  Temp: 37.1 C (!) 36.2 C  SpO2: 98%     Last Pain:  Vitals:   03/26/22 1123  TempSrc:   PainSc: 0-No pain                 Iran Ouch

## 2022-03-26 NOTE — Transfer of Care (Signed)
Immediate Anesthesia Transfer of Care Note  Patient: Tracy Herman  Procedure(s) Performed: CATARACT EXTRACTION PHACO AND INTRAOCULAR LENS PLACEMENT (IOC) RIGHT 15.49 01:12.2 (Right: Eye)  Patient Location: PACU  Anesthesia Type: MAC  Level of Consciousness: awake, alert  and patient cooperative  Airway and Oxygen Therapy: Patient Spontanous Breathing and Patient connected to supplemental oxygen  Post-op Assessment: Post-op Vital signs reviewed, Patient's Cardiovascular Status Stable, Respiratory Function Stable, Patent Airway and No signs of Nausea or vomiting  Post-op Vital Signs: Reviewed and stable  Complications: No notable events documented.

## 2022-04-01 ENCOUNTER — Encounter: Payer: Self-pay | Admitting: Ophthalmology

## 2022-04-09 ENCOUNTER — Ambulatory Visit: Payer: Medicare PPO | Admitting: Anesthesiology

## 2022-04-09 ENCOUNTER — Encounter: Payer: Self-pay | Admitting: Ophthalmology

## 2022-04-09 ENCOUNTER — Encounter
Admission: RE | Disposition: A | Discharge status: Home / Self Care | Payer: Self-pay | Source: Ambulatory Visit | Attending: Ophthalmology

## 2022-04-09 ENCOUNTER — Ambulatory Visit
Admission: RE | Admit: 2022-04-09 | Discharge: 2022-04-09 | Disposition: A | Discharge status: Home / Self Care | Payer: Medicare PPO | Source: Ambulatory Visit | Attending: Ophthalmology | Admitting: Ophthalmology

## 2022-04-09 ENCOUNTER — Other Ambulatory Visit: Payer: Self-pay

## 2022-04-09 DIAGNOSIS — I10 Essential (primary) hypertension: Secondary | ICD-10-CM | POA: Diagnosis not present

## 2022-04-09 DIAGNOSIS — I499 Cardiac arrhythmia, unspecified: Secondary | ICD-10-CM | POA: Diagnosis not present

## 2022-04-09 DIAGNOSIS — Z79899 Other long term (current) drug therapy: Secondary | ICD-10-CM | POA: Insufficient documentation

## 2022-04-09 DIAGNOSIS — H2512 Age-related nuclear cataract, left eye: Secondary | ICD-10-CM | POA: Diagnosis not present

## 2022-04-09 HISTORY — PX: CATARACT EXTRACTION W/PHACO: SHX586

## 2022-04-09 SURGERY — PHACOEMULSIFICATION, CATARACT, WITH IOL INSERTION
Anesthesia: Monitor Anesthesia Care | Site: Eye | Laterality: Left

## 2022-04-09 MED ORDER — MOXIFLOXACIN HCL 0.5 % OP SOLN
OPHTHALMIC | Status: DC | PRN
  Administered 2022-04-09: .2 mL via OPHTHALMIC

## 2022-04-09 MED ORDER — ARMC OPHTHALMIC DILATING DROPS
1.0000 | OPHTHALMIC | Status: DC | PRN
  Administered 2022-04-09 (×3): 1 via OPHTHALMIC

## 2022-04-09 MED ORDER — TETRACAINE HCL 0.5 % OP SOLN
1.0000 [drp] | OPHTHALMIC | Status: DC | PRN
  Administered 2022-04-09 (×2): 1 [drp] via OPHTHALMIC

## 2022-04-09 MED ORDER — SIGHTPATH DOSE#1 BSS IO SOLN
INTRAOCULAR | Status: DC | PRN
  Administered 2022-04-09: 77 mL via OPHTHALMIC

## 2022-04-09 MED ORDER — SIGHTPATH DOSE#1 NA CHONDROIT SULF-NA HYALURON 40-17 MG/ML IO SOLN
INTRAOCULAR | Status: DC | PRN
  Administered 2022-04-09: 1 mL via INTRAOCULAR

## 2022-04-09 MED ORDER — SIGHTPATH DOSE#1 BSS IO SOLN
INTRAOCULAR | Status: DC | PRN
  Administered 2022-04-09: 15 mL

## 2022-04-09 MED ORDER — BRIMONIDINE TARTRATE-TIMOLOL 0.2-0.5 % OP SOLN
OPHTHALMIC | Status: DC | PRN
  Administered 2022-04-09: 1 [drp] via OPHTHALMIC

## 2022-04-09 MED ORDER — LACTATED RINGERS IV SOLN: INTRAVENOUS | Status: DC

## 2022-04-09 MED ORDER — SIGHTPATH DOSE#1 BSS IO SOLN
INTRAOCULAR | Status: DC | PRN
  Administered 2022-04-09: 1 mL via INTRAMUSCULAR

## 2022-04-09 MED ORDER — FENTANYL CITRATE (PF) 100 MCG/2ML IJ SOLN
INTRAMUSCULAR | Status: DC | PRN
  Administered 2022-04-09: 25 ug via INTRAVENOUS

## 2022-04-09 SURGICAL SUPPLY — 8 items
CATARACT SUITE SIGHTPATH (MISCELLANEOUS) ×1 IMPLANT
FEE CATARACT SUITE SIGHTPATH (MISCELLANEOUS) ×1 IMPLANT
GLOVE SURG TRIUMPH 8.0 PF LTX (GLOVE) ×1 IMPLANT
LENS IOL TECNIS EYHANCE 18.5 (Intraocular Lens) IMPLANT
NDL FILTER BLUNT 18X1 1/2 (NEEDLE) ×1 IMPLANT
NEEDLE FILTER BLUNT 18X1 1/2 (NEEDLE) ×1 IMPLANT
SYR 3ML LL SCALE MARK (SYRINGE) ×1 IMPLANT
WATER STERILE IRR 250ML POUR (IV SOLUTION) ×1 IMPLANT

## 2022-04-09 NOTE — Anesthesia Postprocedure Evaluation (Signed)
Anesthesia Post Note  Patient: Tracy Herman  Procedure(s) Performed: CATARACT EXTRACTION PHACO AND INTRAOCULAR LENS PLACEMENT (IOC) LEFT  9.21  00:53.0 (Left: Eye)  Patient location during evaluation: PACU Anesthesia Type: MAC Level of consciousness: awake and alert Pain management: pain level controlled Vital Signs Assessment: post-procedure vital signs reviewed and stable Respiratory status: spontaneous breathing, nonlabored ventilation, respiratory function stable and patient connected to nasal cannula oxygen Cardiovascular status: stable and blood pressure returned to baseline Postop Assessment: no apparent nausea or vomiting Anesthetic complications: no  No notable events documented.   Last Vitals:  Vitals:   04/09/22 1402 04/09/22 1410  BP: (!) 176/98 (!) 187/102  Pulse: 87   Resp: 14   Temp: (!) 36.2 C (!) 36.2 C  SpO2: 95%     Last Pain:  Vitals:   04/09/22 1402  TempSrc:   PainSc: 0-No pain                 Dimas Millin

## 2022-04-09 NOTE — Transfer of Care (Signed)
Immediate Anesthesia Transfer of Care Note  Patient: Tracy Herman  Procedure(s) Performed: CATARACT EXTRACTION PHACO AND INTRAOCULAR LENS PLACEMENT (IOC) LEFT  9.21  00:53.0 (Left: Eye)  Patient Location: PACU  Anesthesia Type: MAC  Level of Consciousness: awake, alert  and patient cooperative  Airway and Oxygen Therapy: Patient Spontanous Breathing and Patient connected to supplemental oxygen  Post-op Assessment: Post-op Vital signs reviewed, Patient's Cardiovascular Status Stable, Respiratory Function Stable, Patent Airway and No signs of Nausea or vomiting  Post-op Vital Signs: Reviewed and stable  Complications: No notable events documented.

## 2022-04-09 NOTE — Op Note (Signed)
PREOPERATIVE DIAGNOSIS:  Nuclear sclerotic cataract of the left eye.   POSTOPERATIVE DIAGNOSIS:  Nuclear sclerotic cataract of the left eye.   OPERATIVE PROCEDURE:ORPROCALL@   SURGEON:  Galen Manila, MD.   ANESTHESIA:  Anesthesiologist: Stephanie Coup, MD CRNA: Domenic Moras, CRNA  1.      Managed anesthesia care. 2.     0.18ml of Shugarcaine was instilled following the paracentesis   COMPLICATIONS:  None.   TECHNIQUE:   Stop and chop   DESCRIPTION OF PROCEDURE:  The patient was examined and consented in the preoperative holding area where the aforementioned topical anesthesia was applied to the left eye and then brought back to the Operating Room where the left eye was prepped and draped in the usual sterile ophthalmic fashion and a lid speculum was placed. A paracentesis was created with the side port blade and the anterior chamber was filled with viscoelastic. A near clear corneal incision was performed with the steel keratome. A continuous curvilinear capsulorrhexis was performed with a cystotome followed by the capsulorrhexis forceps. Hydrodissection and hydrodelineation were carried out with BSS on a blunt cannula. The lens was removed in a stop and chop  technique and the remaining cortical material was removed with the irrigation-aspiration handpiece. The capsular bag was inflated with viscoelastic and the Technis [id number] lens was placed in the capsular bag without complication. The remaining viscoelastic was removed from the eye with the irrigation-aspiration handpiece. The wounds were hydrated. The anterior chamber was flushed with BSS and the eye was inflated to physiologic pressure. 0.54ml Vigamox was placed in the anterior chamber. The wounds were found to be water tight. The eye was dressed with Combigan. The patient was given protective glasses to wear throughout the day and a shield with which to sleep tonight. The patient was also given drops with which to begin a drop  regimen today and will follow-up with me in one day. Implant Name Type Inv. Item Serial No. Manufacturer Lot No. LRB No. Used Action  LENS IOL TECNIS EYHANCE 18.5 - [id number] Intraocular Lens LENS IOL TECNIS EYHANCE 18.5 [id number] [employer]  Left 1 Implanted    Procedure(s): CATARACT EXTRACTION PHACO AND INTRAOCULAR LENS PLACEMENT (IOC) LEFT  9.21  00:53.0 (Left)  Electronically signed: Galen Manila 04/09/2022 2:01 PM

## 2022-04-09 NOTE — Anesthesia Preprocedure Evaluation (Addendum)
Anesthesia Evaluation  Patient identified by MRN, date of birth, ID band Patient awake    Reviewed: Allergy & Precautions, NPO status , Patient's Chart, lab work & pertinent test results  History of Anesthesia Complications Negative for: history of anesthetic complications  Airway Mallampati: II  TM Distance: >3 FB Neck ROM: Full    Dental  (+) Chipped   Pulmonary neg sleep apnea, neg COPD, Patient abstained from smoking.Not current smoker, former smoker   Pulmonary exam normal breath sounds clear to auscultation       Cardiovascular Exercise Tolerance: Poor METShypertension, Pt. on medications (-) CAD and (-) Past MI + dysrhythmias  Rhythm:Regular Rate:Normal - Systolic murmurs    Neuro/Psych  PSYCHIATRIC DISORDERS  Depression    negative neurological ROS     GI/Hepatic ,neg GERD  ,,(+)     (-) substance abuse    Endo/Other  neg diabetes    Renal/GU Renal disease     Musculoskeletal   Abdominal   Peds  Hematology   Anesthesia Other Findings Patient states she has a history of an arrhythmia. States she follows closely with Dr. Arline Asp and is aware of her heart rate. Patient states she feels well and denies any shortness of breath, chest pain, or fluttering in her chest.   Past Medical History: No date: AR (allergic rhinitis) No date: Bursitis No date: Cystocele No date: Depression No date: Hyperlipemia No date: Hypertension No date: Menopause No date: PKD (polycystic kidney disease) No date: Rectocele  Reproductive/Obstetrics                             Anesthesia Physical Anesthesia Plan  ASA: 3  Anesthesia Plan: MAC   Post-op Pain Management:    Induction: Intravenous  PONV Risk Score and Plan: 2  Airway Management Planned: Natural Airway and Nasal Cannula  Additional Equipment: None  Intra-op Plan:   Post-operative Plan:   Informed Consent: I have reviewed the  patients History and Physical, chart, labs and discussed the procedure including the risks, benefits and alternatives for the proposed anesthesia with the patient or authorized representative who has indicated his/her understanding and acceptance.     Dental Advisory Given  Plan Discussed with: Anesthesiologist, CRNA and Surgeon  Anesthesia Plan Comments: (Patient consented for risks of anesthesia including but not limited to:  - adverse reactions to medications - damage to eyes, teeth, lips or other oral mucosa - nerve damage due to positioning  - sore throat or hoarseness - Damage to heart, brain, nerves, lungs, other parts of body or loss of life  Patient voiced understanding.)        Anesthesia Quick Evaluation

## 2022-04-09 NOTE — H&P (Signed)
[facility]   Primary Care Physician:  Kandyce Rud, MD Ophthalmologist: Dr. Maren Reamer  Pre-Procedure History & Physical: HPI:  Tracy Herman is a 81 y.o. female here for cataract surgery.   Past Medical History:  Diagnosis Date   AR (allergic rhinitis)    Bursitis    Cystocele    Depression    Hyperlipemia    Hypertension    Menopause    PKD (polycystic kidney disease)    Rectocele    Small intestinal bacterial overgrowth (SIBO)    Uses wheelchair    Able to stand and bear weight    Past Surgical History:  Procedure Laterality Date   ABDOMINAL HYSTERECTOMY  1975   TAH- MENORRHAGIA - FIBROIDS   ANTERIOR AND POSTERIOR VAGINAL REPAIR     WITH ENTEROCELE LIGATION   APPENDECTOMY  1975   BLADDER SURGERY     BREAST SURGERY     BILATERAL BREAST REDUCTION   CATARACT EXTRACTION W/PHACO Right 03/26/2022   Procedure: CATARACT EXTRACTION PHACO AND INTRAOCULAR LENS PLACEMENT (IOC) RIGHT 15.49 01:12.2;  Surgeon: Galen Manila, MD;  Location: [facility];  Service: Ophthalmology;  Laterality: Right;   LAPAROTOMY     W.PARTIAL BOWEL RESECTION D/T BOWEL NECROSIS TO ADHESIONS   QUADRICEPS TENDON REPAIR Left 05/24/2021   Procedure: OPEN REPAIR OF CHRONIC LEFT GLUTEUS MEDIUS TENDON TEAR;  Surgeon: Christena Flake, MD;  Location: [facility];  Service: Orthopedics;  Laterality: Left;   REDUCTION MAMMAPLASTY Bilateral 1992    Prior to Admission medications   Medication Sig Start Date End Date Taking? Authorizing Provider  acetaminophen (TYLENOL) 500 MG tablet Take 500-1,000 mg by mouth every 6 (six) hours as needed for moderate pain or mild pain.   Yes [provider]  calcium elemental as carbonate (TUMS ULTRA 1000) 400 MG chewable tablet Chew 1,000 mg by mouth daily.   Yes [provider]  captopril (CAPOTEN) 25 MG tablet Take 1 tablet (25 mg total) by mouth 2 (two) times daily. 12/29/20 04/09/22 Yes Sreenath, Sudheer B, MD  cholecalciferol (VITAMIN D) 1000  units tablet Take 1,000 Units by mouth daily.   Yes [provider]  Cyanocobalamin 3000 MCG LOZG Take 3,000 mcg by mouth daily.   Yes [provider]  psyllium (METAMUCIL) 58.6 % powder Take 1 packet by mouth daily.   Yes [provider]  pyridOXINE (B-6) 50 MG tablet Take 50 mg by mouth daily.   Yes [provider]  conjugated estrogens (PREMARIN) vaginal cream Place 1 applicator vaginally 2 (two) times a week. Patient not taking: Reported on 03/26/2022    [provider]  hydrocortisone cream 1 % Apply 1 application topically daily as needed for itching. Patient not taking: Reported on 03/26/2022    [provider]    Allergies as of 01/28/2022 - Review Complete 01/03/2022  Allergen Reaction Noted   Codeine  08/14/2015   Zestril [lisinopril]  08/14/2015   Latex Rash 05/18/2021    Family History  Problem Relation Age of Onset   Polycystic kidney disease Mother    Polycystic kidney disease Sister    Cancer Neg Hx    Diabetes Neg Hx    Heart disease Neg Hx     Social History   Socioeconomic History   Marital status: Married    Spouse name: Not on file   Number of children: Not on file   Years of education: Not on file   Highest education level: Not on file  Occupational  History   Not on file  Tobacco Use   Smoking status: Former    Types: Cigarettes    Quit date: 1977    Years since quitting: 46.9   Smokeless tobacco: Never  Vaping Use   Vaping Use: Never used  Substance and Sexual Activity   Alcohol use: Yes    Alcohol/week: 7.0 standard drinks of alcohol    Types: 7 Glasses of wine per week    Comment: RED WINE QD   Drug use: No   Sexual activity: Not Currently    Birth control/protection: Surgical  Other Topics Concern   Not on file  Social History Narrative   Lives with hubby .   Social Determinants of Health   Financial Resource Strain: Not on file  Food Insecurity: Not on file  Transportation Needs:  Not on file  Physical Activity: Sufficiently Active (10/22/2017)   Exercise Vital Sign    Days of Exercise per Week: 5 days    Minutes of Exercise per Session: 30 min  Stress: Not on file  Social Connections: Not on file  Intimate Partner Violence: Not on file    Review of Systems: See HPI, otherwise negative ROS  Physical Exam: BP (!) 177/105   Pulse 75   Temp (!) 97.2 F (36.2 C) (Oral)   Resp 18   Ht 5\' 4"  (1.626 m)   Wt 47.3 kg   SpO2 98%   BMI 17.89 kg/m  General:   Alert, cooperative in NAD Head:  Normocephalic and atraumatic. Respiratory:  Normal work of breathing. Cardiovascular:  RRR  Impression/Plan: Tracy Herman is here for cataract surgery.  Risks, benefits, limitations, and alternatives regarding cataract surgery have been reviewed with the patient.  Questions have been answered.  All parties agreeable.   Christell Constant, MD  04/09/2022, 1:28 PM

## 2022-04-09 NOTE — Discharge Instructions (Signed)

## 2022-04-10 ENCOUNTER — Encounter: Payer: Self-pay | Admitting: Ophthalmology

## 2022-08-10 IMAGING — MG MM DIGITAL SCREENING BILAT W/ TOMO AND CAD
6 of 10 series · 6 of 30 positions shown · non-contrast
Comparison: Previous exam(s).

CLINICAL DATA: Screening.

EXAM:
DIGITAL SCREENING BILATERAL MAMMOGRAM WITH TOMOSYNTHESIS AND CAD
TECHNIQUE: Bilateral screening digital craniocaudal and mediolateral oblique
mammograms were obtained. Bilateral screening digital breast
tomosynthesis was performed. The images were evaluated with
computer-aided detection.

[R CC synth-2D]
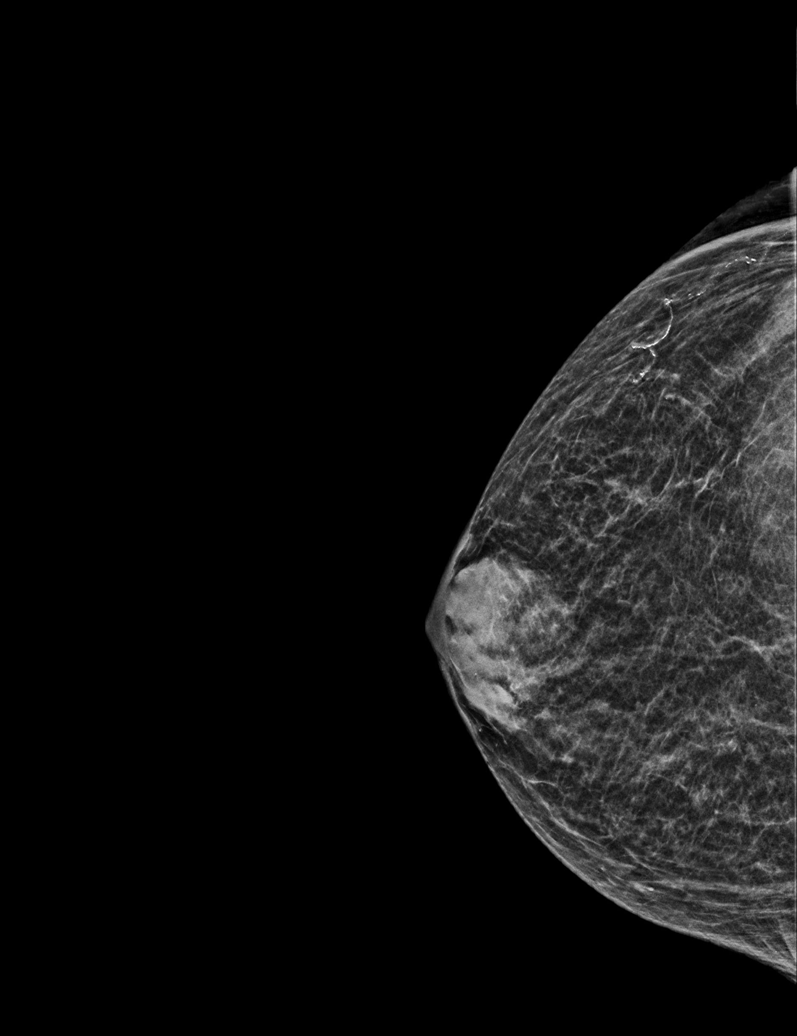

[R MLO synth-2D]
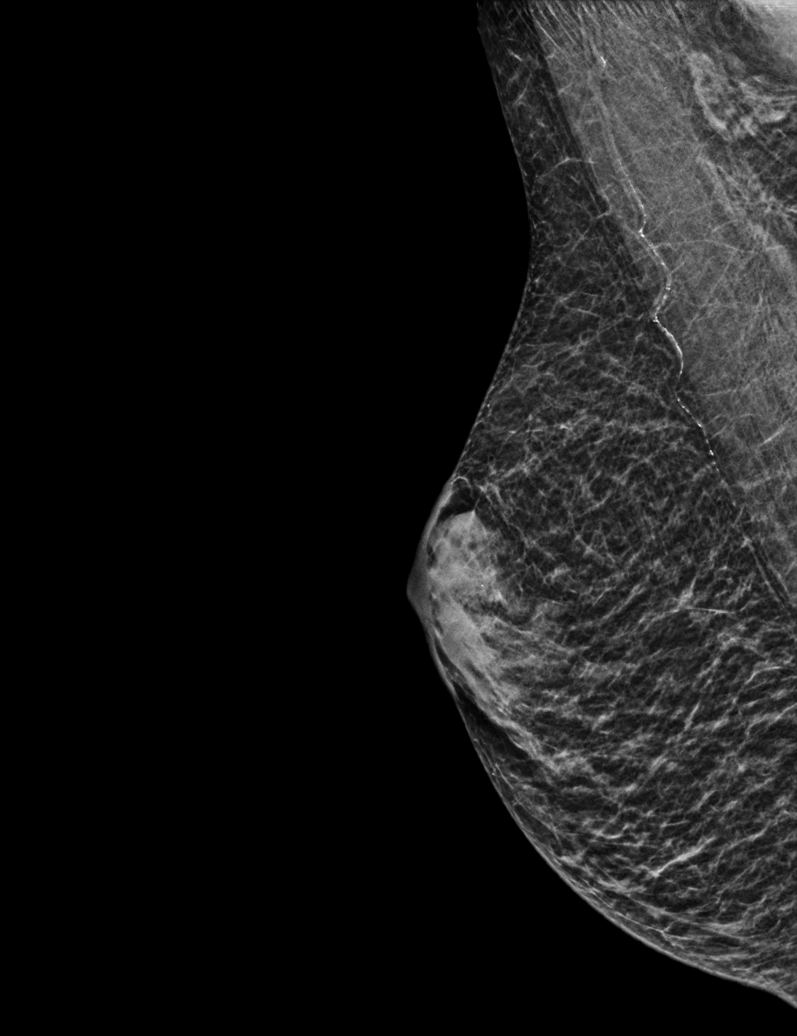

[L MLO synth-2D (1 of 2)]
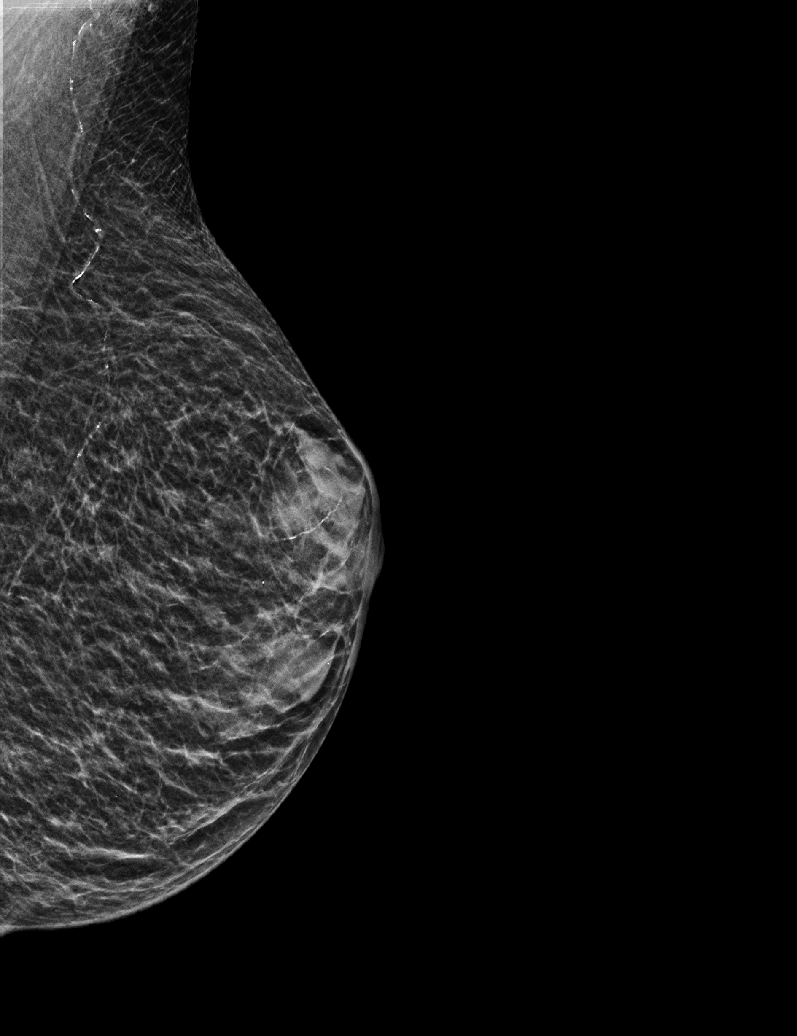

[L CC synth-2D]
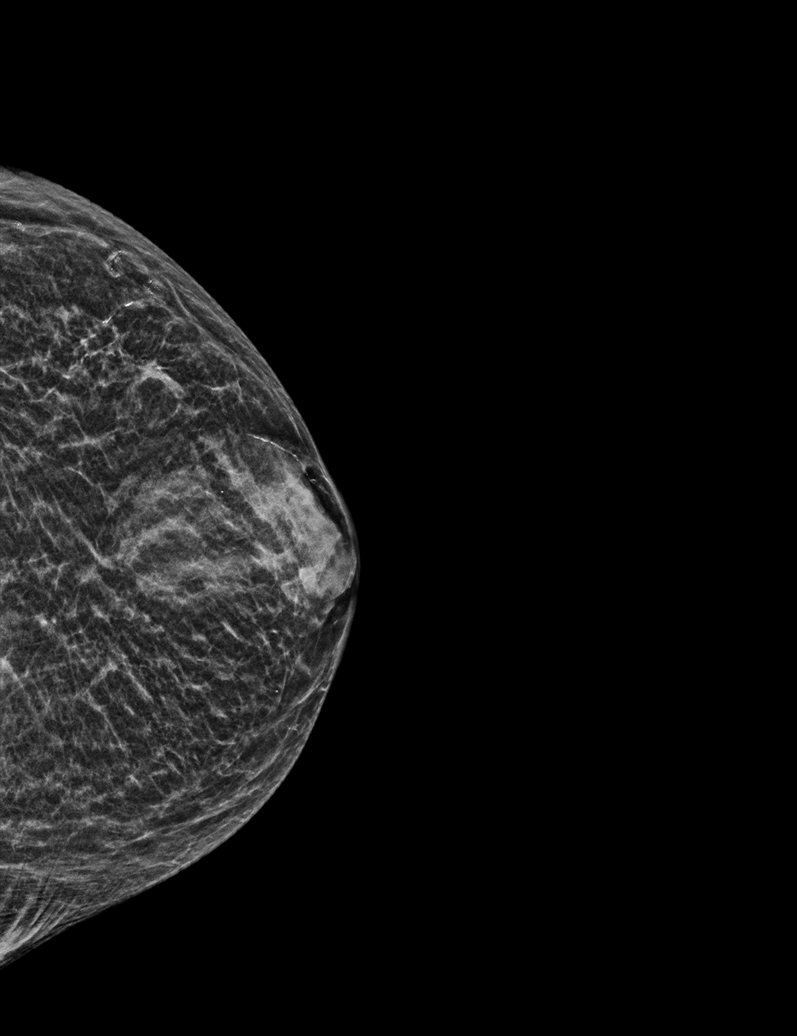

[L MLO synth-2D (2 of 2)]
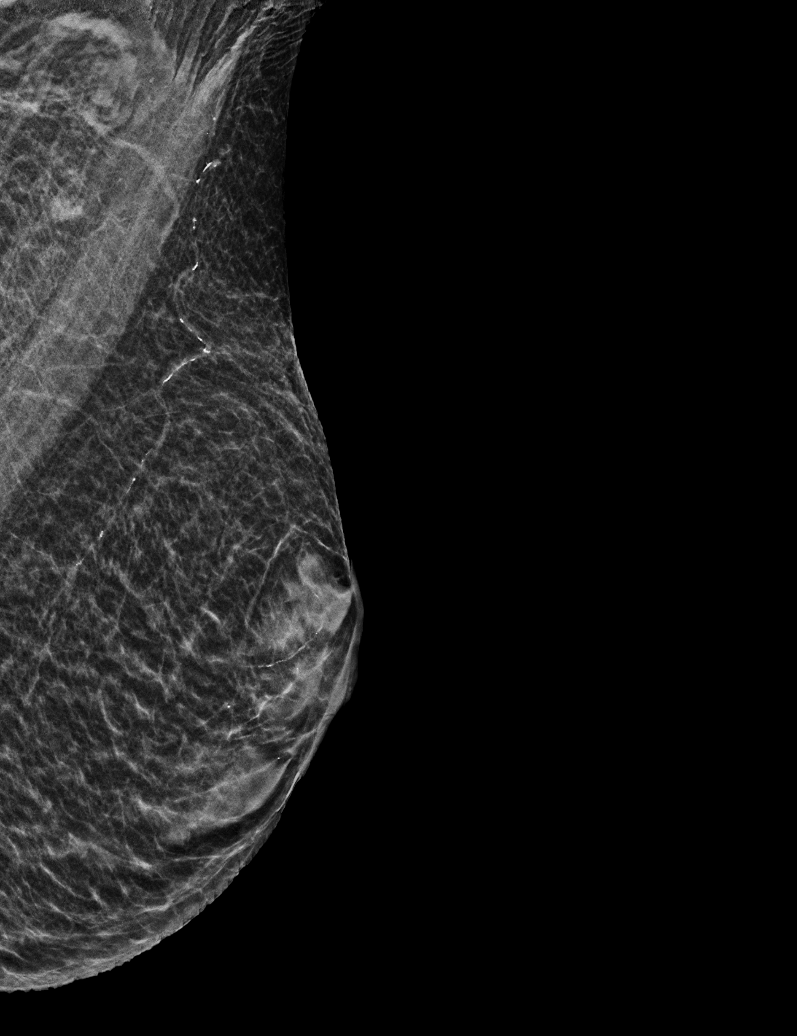

[L CC tomo · tomo slice 15/28.0]
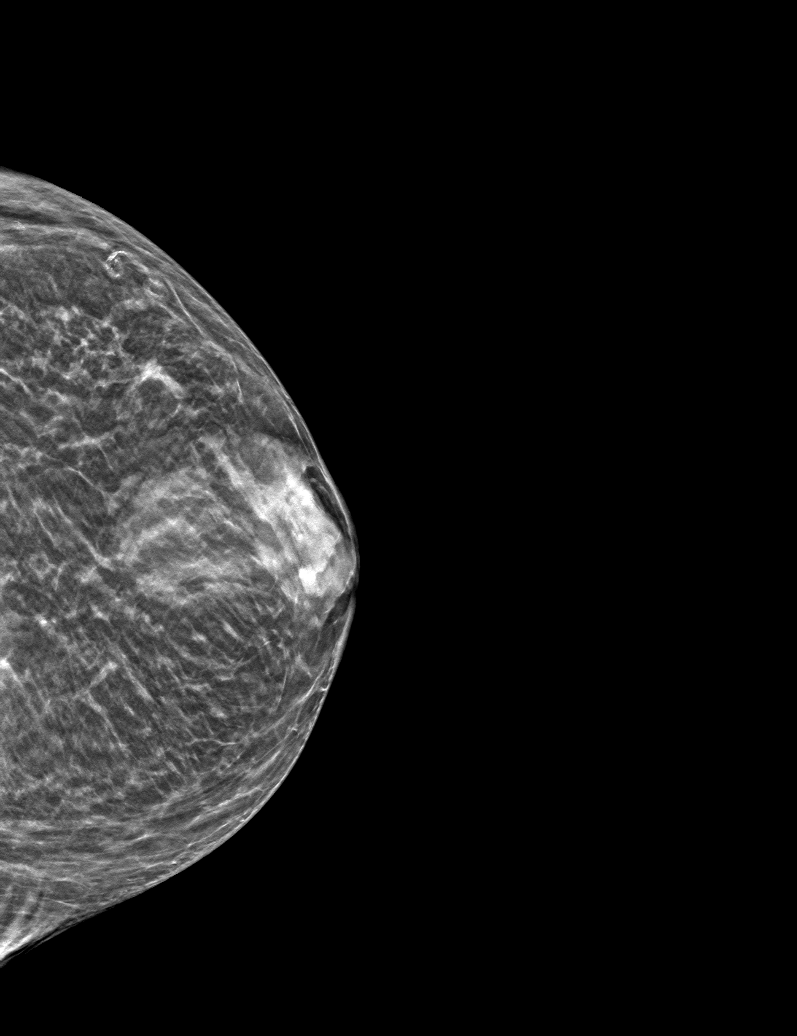

[6 of 30 positions shown; findings below may reference images not displayed]

ACR Breast Density Category c: The breast tissue is heterogeneously
dense, which may obscure small masses.
FINDINGS: There are no findings suspicious for malignancy.
IMPRESSION: No mammographic evidence of malignancy. A result letter of this
screening mammogram will be mailed directly to the patient.

RECOMMENDATION:
Screening mammogram in one year. (Code:Q3-W-BC3)

BI-RADS CATEGORY  1: Negative.
